# Patient Record
Sex: Male | Born: 1935 | Race: White | Hispanic: No | Marital: Married | Smoking: Former smoker
Health system: Southern US, Community
[De-identification: ages and names within clinical notes are randomized; demographics above are authoritative.]

## PROBLEM LIST (undated history)

## (undated) DIAGNOSIS — G709 Myoneural disorder, unspecified: Secondary | ICD-10-CM

## (undated) DIAGNOSIS — I10 Essential (primary) hypertension: Secondary | ICD-10-CM

## (undated) DIAGNOSIS — Z8719 Personal history of other diseases of the digestive system: Secondary | ICD-10-CM

## (undated) DIAGNOSIS — E119 Type 2 diabetes mellitus without complications: Secondary | ICD-10-CM

## (undated) DIAGNOSIS — J449 Chronic obstructive pulmonary disease, unspecified: Secondary | ICD-10-CM

## (undated) DIAGNOSIS — K219 Gastro-esophageal reflux disease without esophagitis: Secondary | ICD-10-CM

## (undated) DIAGNOSIS — H33011 Retinal detachment with single break, right eye: Secondary | ICD-10-CM

## (undated) HISTORY — PX: HEMORROIDECTOMY: SUR656

## (undated) HISTORY — PX: JOINT REPLACEMENT: SHX530

## (undated) HISTORY — PX: POPLITEAL VENOUS ANEURYSM REPAIR: SHX2244

---

## 2000-11-28 ENCOUNTER — Inpatient Hospital Stay (HOSPITAL_COMMUNITY): Admission: EM | Admit: 2000-11-28 | Discharge: 2000-12-04 | Payer: Self-pay | Admitting: Emergency Medicine

## 2000-11-28 ENCOUNTER — Encounter: Payer: Self-pay | Admitting: Emergency Medicine

## 2001-09-30 ENCOUNTER — Encounter: Payer: Self-pay | Admitting: Emergency Medicine

## 2001-09-30 ENCOUNTER — Emergency Department (HOSPITAL_COMMUNITY): Admission: EM | Admit: 2001-09-30 | Discharge: 2001-09-30 | Payer: Self-pay | Admitting: Emergency Medicine

## 2002-09-15 ENCOUNTER — Encounter: Payer: Self-pay | Admitting: Orthopaedic Surgery

## 2002-09-15 ENCOUNTER — Inpatient Hospital Stay (HOSPITAL_COMMUNITY): Admission: RE | Admit: 2002-09-15 | Discharge: 2002-09-19 | Payer: Self-pay | Admitting: Orthopaedic Surgery

## 2002-11-09 ENCOUNTER — Ambulatory Visit (HOSPITAL_COMMUNITY): Admission: RE | Admit: 2002-11-09 | Discharge: 2002-11-10 | Payer: Self-pay | Admitting: Ophthalmology

## 2002-11-12 ENCOUNTER — Ambulatory Visit (HOSPITAL_COMMUNITY): Admission: AD | Admit: 2002-11-12 | Discharge: 2002-11-13 | Payer: Self-pay | Admitting: Ophthalmology

## 2004-12-02 ENCOUNTER — Ambulatory Visit: Payer: Self-pay | Admitting: Gastroenterology

## 2004-12-17 ENCOUNTER — Encounter (INDEPENDENT_AMBULATORY_CARE_PROVIDER_SITE_OTHER): Payer: Self-pay | Admitting: *Deleted

## 2004-12-17 ENCOUNTER — Ambulatory Visit: Payer: Self-pay | Admitting: Gastroenterology

## 2005-10-27 ENCOUNTER — Ambulatory Visit (HOSPITAL_COMMUNITY): Admission: RE | Admit: 2005-10-27 | Discharge: 2005-10-27 | Payer: Self-pay | Admitting: Ophthalmology

## 2006-07-02 ENCOUNTER — Ambulatory Visit (HOSPITAL_COMMUNITY): Admission: RE | Admit: 2006-07-02 | Discharge: 2006-07-02 | Payer: Self-pay | Admitting: Orthopaedic Surgery

## 2006-08-20 ENCOUNTER — Inpatient Hospital Stay (HOSPITAL_COMMUNITY): Admission: RE | Admit: 2006-08-20 | Discharge: 2006-08-23 | Payer: Self-pay | Admitting: Orthopaedic Surgery

## 2008-12-29 ENCOUNTER — Encounter (INDEPENDENT_AMBULATORY_CARE_PROVIDER_SITE_OTHER): Payer: Self-pay | Admitting: *Deleted

## 2009-01-04 ENCOUNTER — Encounter: Payer: Self-pay | Admitting: Internal Medicine

## 2010-04-20 ENCOUNTER — Emergency Department (HOSPITAL_COMMUNITY): Admission: EM | Admit: 2010-04-20 | Discharge: 2010-04-20 | Payer: Self-pay | Admitting: Emergency Medicine

## 2010-08-25 ENCOUNTER — Encounter: Payer: Self-pay | Admitting: Orthopaedic Surgery

## 2010-10-17 LAB — POCT CARDIAC MARKERS
CKMB, poc: 15.4 ng/mL (ref 1.0–8.0)
Myoglobin, poc: 344 ng/mL (ref 12–200)
Myoglobin, poc: 430 ng/mL (ref 12–200)
Troponin i, poc: <0.05 ng/mL (ref 0.00–0.09)
Troponin i, poc: <0.05 ng/mL (ref 0.00–0.09)

## 2010-10-17 LAB — URINALYSIS, ROUTINE W REFLEX MICROSCOPIC
Glucose, UA: NEGATIVE mg/dL
Nitrite: NEGATIVE
Urobilinogen, UA: 0.2 mg/dL (ref 0.0–1.0)
pH: 7.5 (ref 5.0–8.0)

## 2010-10-17 LAB — CBC
Hemoglobin: 11.8 g/dL — ABNORMAL LOW (ref 13.0–17.0)
Platelets: 175 10*3/uL (ref 150–400)
RBC: 4 MIL/uL — ABNORMAL LOW (ref 4.22–5.81)
WBC: 7.9 10*3/uL (ref 4.0–10.5)

## 2010-10-17 LAB — DIFFERENTIAL
Eosinophils Relative: 2 % (ref 0–5)
Monocytes Absolute: 0.5 10*3/uL (ref 0.1–1.0)

## 2010-10-17 LAB — CK TOTAL AND CKMB (NOT AT ARMC)
Relative Index: 2.7 — ABNORMAL HIGH (ref 0.0–2.5)
Total CK: 956 U/L — ABNORMAL HIGH (ref 7–232)

## 2010-10-17 LAB — GLUCOSE, CAPILLARY: Glucose-Capillary: 164 mg/dL — ABNORMAL HIGH (ref 70–99)

## 2010-12-20 NOTE — Op Note (Signed)
[name], [name]                          ACCOUNT NO.:  [id number]   MEDICAL RECORD NO.:  [id number]                   PATIENT TYPE:  OIB   LOCATION:  5707                                 FACILITY:  [facility]   PHYSICIAN:  Guadelupe Sabin, M.D.             DATE OF BIRTH:  [birth year]   DATE OF PROCEDURE:  11/09/2002  DATE OF DISCHARGE:  11/10/2002                                 OPERATIVE REPORT   PREOPERATIVE DIAGNOSIS:  Rhegmatogenous retinal detachment, right eye.   POSTOPERATIVE DIAGNOSIS:  Rhegmatogenous retinal detachment, right eye.   NAME OF OPERATION:  Scleral buckling procedure, right eye, using solid  silicone implants, #s 279 and 240.  Cryo application, diathermy application,  and drainage of subretinal fluid.   SURGEON:  Stormy Fabian, M.D.   ASSISTANT:  Nurse.   ANESTHESIA:  General.   OPHTHALMOSCOPY:  As previously described.   OPERATIVE PROCEDURE:  After the patient was prepped and draped, lid traction  sutures were placed in the right upper and lower lids.  A speculum was then  inserted.  A peritomy was performed adjacent to the limbus 360 degrees.  The  subconjunctival tissue was cleaned and felt to be in satisfactory condition  for the proposed lamellar scleral dissection.  As the retinal detachment was  over and in the area of the lateral rectus muscle, this muscle was detached  from its anatomic insertion and a 4-0 chromic catgut suture placed in its  tendon for later replacement.  This provided adequate exposure of the  retinal detachment area.  Localization was then carried out with the retinal  cryoprobe and the tear itself treated.  It was then elected to perform  lamellar scleral dissection from the 11-11:30 to 8 o'clock position, the bed  measuring initially 7 mm but later extended to 9 mm in width to accommodate  a #279 solid silicone implant.  Light diathermy applications were applied to  the inner scleral lamella.  The patient was given  Diamox intravenously to  lower the intraocular pressure.  A total of three 4-0 green Mersilene  sutures were used to close the scleral flaps with 3-0 plain catgut  reinforcements over a trimmed #279 solid silicone implant.  A #240 solid  silicone encircling band was placed about the globe, tied with two sutures  at the 4 o'clock position.  Anchoring sutures were later placed at the 2 and  4 o'clock positions to hold the encircling band in place.  The patient had  been given Diamox 500 mg intravenously to lower the intraocular pressure.  The scleral flaps were gradually pulled up but the intraocular pressure was  elevated. It was therefore elected to perform a paracentesis using a Wheeler  knife and a limbal incision through the cornea to allow the aqueous to exit  and the intraocular pressure to be lowered.  This was performed on 2-3  occasions.  The retinal artery  perfusion was continually monitored  throughout the procedure.  The scleral flaps were closed but there still  appeared to be slight elevation of the retina and posterior fluid.  It was  therefore elected to do a transscleral drainage of the subretinal fluid even  though it was of a limited amount.  An incision was made through the scleral  lamella, the choroid treated with light diathermy and then perforated with  the pin electrode.  A small amount of subretinal fluid drained, further  lowering the intraocular pressure.  There was no retinal or choroidal  hemorrhage seen or bloody drainage from the drain site.  The scleral flaps  were pulled up creating a better scleral buckling defect and there appeared  to be only a minimal amount of subretinal fluid remaining.  The tear itself  appeared to be flat on the implant surface although elevated over it.  It  was therefore elected to close, feeling that further manipulation might  cause further deterioration in the retinal tear status.  The tension of the  encircling band and the  scleral flaps were secured permanently.  Tenon's  capsule was pulled forward in the four quadrants and tied as a separate  layer.  The conjunctiva was then pulled forward and closed with a running 6-  0 chromic catgut suture.  Depot Garamycin and dexamethasone were injected in  the subtenon space inferiorly.  A light patch and protective shield were  applied to the operated right eye along with Maxitrol and Atropine ointment.  Duration of procedure 1-1/2 hours.  The patient tolerated the procedure well  in general, left the operating room for the recovery room in good condition.                                               Guadelupe Sabin, M.D.    HNJ/MEDQ  D:  11/11/2002  T:  11/12/2002  Job:  [id number]

## 2010-12-20 NOTE — H&P (Signed)
[name], [name]                          ACCOUNT NO.:  [id number]   MEDICAL RECORD NO.:  [id number]                   PATIENT TYPE:  OIB   LOCATION:  5707                                 FACILITY:  [facility]   PHYSICIAN:  Guadelupe Sabin, M.D.             DATE OF BIRTH:  1932/05/12   DATE OF ADMISSION:  11/09/2002  DATE OF DISCHARGE:  11/10/2002                                HISTORY & PHYSICAL   This was an urgent outpatient admission of this 77 year-old white male  admitted with a rhegmatogenous retinal detachment of the right eye.   HISTORY OF PRESENT ILLNESS:  This patient on 11/05/2002 noted a sudden onset  of decreased vision in his right eye associated with a large floater and  lightening flashes.  The patient was seen by Dr. Elise Benne, his regular  ophthalmologist, and the diagnosis of a retinal detachment made.  The  patient was referred to my office for further evaluation and surgical care.   PAST MEDICAL HISTORY:  The patient is under the care of Dr. Cristy Friedlander.  Dr.  Cristy Friedlander notes that the patient has non-insulin dependent diabetes mellitus,  well-controlled on Actos and Glucotrol XL.  The patient also takes Advair  inhaler and Singular for asthma.   ALLERGIES:  Patient has asthma but no definite allergies to drugs.   REVIEW OF SYSTEMS:  No cardiorespiratory complaints.  The patient states  that he is stable at the present time with only occasional tightness of  his asthma.   PHYSICAL EXAMINATION:  VITAL SIGNS:  As recorded on admission, blood  pressure 159/79, respirations 16, heart rate 87, temperature 99.1.  GENERAL APPEARANCE:  The patient is a well-developed, well-nourished white  male in no acute distress.  HEENT:  Eyes:  Visual acuity without correction 20/100 right eye,  20/80  left eye, with correction 20/40 right eye, 20/30 left eye.  Applanation  tonometry 13 mm each eye.  External ocular and slit-lamp examination:  The  eyes are wet and clear with a  clear cornea, deep and clear anterior chamber  and clear lens.  Detailed fundus examination:  Right eye reveals a temporal  retinal detachment with an area of lattice retinal degeneration and an  associated horseshoe retinal tear along its border.  The retinal detachment  does not extend into the macular area.  Left eye normal.  CHEST:  Lungs clear to percussion and auscultation.  Rare rhonchi heard.  No  rales.  ABDOMEN:  Negative.  EXTREMITIES:  Negative.   ADMISSION DIAGNOSIS:  Rhegmatogenous retinal detachment, right eye, single  defect    SURGICAL PLAN:  Scleral buckling procedure, right eye, with possible  vitrectomy.  Patient and his family have been given oral discussion and  printed information concerning the procedure and its possible complications.  They have signed an Informed Consent and arrangements made for his  outpatient admission at this time.  Guadelupe Sabin, M.D.    HNJ/MEDQ  D:  11/11/2002  T:  11/12/2002  Job:  [id number]   cc:   Eda Paschal  [address]  [place]  [place] [address]  Fax: [phone]   Vincenza Hews, M.D.  [address]  [place]  [place] [address]  Fax: [phone]

## 2010-12-20 NOTE — Op Note (Signed)
NAMEJERAD, DUNLAP NO.:  [id number]   MEDICAL RECORD NO.:  [id number]          PATIENT TYPE:  INP   LOCATION:  2550                         FACILITY:  [facility]   PHYSICIAN:  Claude Manges. Whitfield, M.D.DATE OF BIRTH:  1932/04/30   DATE OF PROCEDURE:  08/20/2006  DATE OF DISCHARGE:                               OPERATIVE REPORT   PREOPERATIVE DIAGNOSIS:  Failed bipolar arthroplasty, right hip.   POSTOPERATIVE DIAGNOSIS:  Failed bipolar arthroplasty, right hip.   PROCEDURE:  Revision arthroplasty, right hip.   SURGEON:  Claude Manges. Cleophas Dunker, M.D.   ASSISTANT:  Thereasa Distance A. Chaney Malling, M.D. and Arnoldo Morale, P.A.-C.   ANESTHESIA:  General.   COMPLICATIONS:  None.   COMPONENTS:  Deep profile acetabular cuff, 60 mm diameter, with Gription  coating; a Pinnacle Marathon acetabular liner, +4 with a 10-degree  posterior lip and a 36-mm outer diameter femoral head and a +5 neck  length.   PROCEDURE:  With the patient comfortable on the operating table and  under general orotracheal anesthesia, nursing staff inserted a Foley  catheter.  The patient was then placed in the lateral decubitus position  with the right side up.  The patient was secured to the operating table  with the Innomed hip system.   The right hip was then prepped with Betadine scrub and then DuraPrep  from iliac crest to about the midcalf.  Sterile draping was performed.   The previous Southern incision centered over the greater trochanter was  utilized and via sharp dissection carried down to subcutaneous tissue.  Small bleeders were Bovie coagulated.  Self-retaining retractors were  inserted.  The iliotibial band was identified and incised along the  length of the skin incision.  The retractors were placed more deeply.  With the hip internally rotated, the scarred in short external rotators  were then incised.  The capsule was identified and incised.  There was a  moderate amount of slightly  orange-tinged joint fluid.  This was sent  for stat gram stain revealing very few monocytes and no organisms.  We  did send for final aerobic and anaerobic culture.  The capsule was then  opened deeply, and additional synovial fluid was removed.  There was  abundant synovial tissue within the joint that was debrided with a  rongeur and the Bovie.  It was able to be dislocated posteriorly, and by  using a bone tamp, I was able to remove the 28-mm head with the  polyethylene liner and the metallic outer ball in one piece.  The joint  was further explored, and a radical synovectomy was performed.  I was  able to release the capsule enough that I could place the [facility] taper  portion of the femoral component anteriorly, and I also placed a suture  over the capsule posteriorly so I could retract it out of the field and  protect the sciatic nerve.  There was a thickened synovial liner within  the acetabulum.  This was removed so at this point we could easily  visualize the entire acetabulum.  There was evidence of some early  protrusio, but the bone was nice and thick, and there were no areas that  were devoid of bone for 360 degrees.  Reaming was performed sequentially  from 45 mm outer diameter to a 59 mm outer diameter.  We had nice  bleeding bone.  There were several areas were there had been cyst  formation.  These were filled with bone graft, both from the patient's  reaming as well as from the IC graft chamber.  We at that point trialed  several size acetabula.  We felt that the 58 would not completely seat,  had very nice rim fit, so we elected to use a 60-mm outer diameter.  Even with the 60- mm revision thickened acetabular cup, there was still  room for bone graft.  Accordingly, the remainder of the bone graft was  then placed into the center of the acetabulum and then ground with a  reverse reamer.  The final revision metallic acetabulum was then  impacted.  We had very, very nice  purchase, and it was perfectly stable.  We felt that we had seated it as far as the bone graft.  We trialed the  polyethylene +4 liner and then applied the 36-mm outer diameter hip  ball.  We trialed several neck lengths and felt that the +5 was the most  stable.  The patient was approximately a half an inch short  preoperatively, and we had reestablished leg length equality using the  +5 neck.   The trial components were then removed.  The joint was then copiously  irrigated with saline solution.  The final Marathon polyethylene liner  was then impacted.  We cleaned the Morse taper neck, made sure that  there was no loose material or bone graft loose within the acetabulum.  We then applied the 36-mm outer diameter hip ball with a +5 neck length.  The entire construct was then reduced, and through a full range of  motion we had perfect stability.  The wound was again irrigated with  saline solution.  I closed the capsule anatomically with a #1 Ethibond.  The short external rotators were closed with a similar material.  I used  a #1 Vicryl to close the iliotibial band and a 0 and 2-0 Vicryl for the  subcutaneous tissue.  The skin was closed with skin clips.  A sterile  bulky dressing was applied.   The patient was then awoken, placed supine on the operating stretcher.  Leg lengths appeared to be symmetrical, and he was moving his toes in  dorsiflexion and plantarflexion.  A knee immobilizer was applied.  The  patient returned to the postanesthesia recovery room in satisfactory  condition.      Claude Manges. Cleophas Dunker, M.D.  Electronically Signed     PWW/MEDQ  D:  08/20/2006  T:  08/20/2006  Job:  [id number]

## 2010-12-20 NOTE — H&P (Signed)
   [name], [name]                          ACCOUNT NO.:  [id number]   MEDICAL RECORD NO.:  [id number]                   PATIENT TYPE:  OIB   LOCATION:  5712                                 FACILITY:  [facility]   PHYSICIAN:  [name], M.D.             DATE OF BIRTH:  Aug 04, 1932   DATE OF ADMISSION:  11/12/2002  DATE OF DISCHARGE:  11/13/2002                                HISTORY & PHYSICAL   REASON FOR ADMISSION:  This was a planned urgent outpatient readmission of  this 77 year old white male admitted with a persistent rhegmatogenous  retinal detachment of the right eye.   HISTORY OF PRESENT ILLNESS:  This patient was found to have a rhegmatogenous  retinal detachment of the right eye.  He was previously admitted on 11/09/2002  for a scleral buckling procedure of the right eye performed under general  anesthesia.  At surgery, there was some residual subretinal fluid which was  felt would absorbe spontaneously.  The patient was discharged from the  hospital on 11/10/2002 and placed at bed rest with bathroom privileges at  home.  He returned to the office, and it was apparent that the retina was  not settling on the previously placed implant and that further surgery would  be indicated.  The patient, therefore, is admitted at this time for a  posterior vitrectomy with vitreous air exchange and additional laser  photocoagulation.  (See previous admission History and Physical, Operative  Note, and Discharge Summary.)   HOSPITAL COURSE:  The patient was evaluated preoperatively and felt to be in  satisfactory condition for the proposed surgery.  His blood sugar was 127 on  the morning of admission, and the patient was Jeff.p.o. in anticipation of the  surgery.  The patient, therefore, was taken into the operating room where a  posterior vitrectomy was performed with vitreous Perfluoron and Perfluoron  air exchange and indirect laser photocoagulation.                        [name], M.D.    HNJ/MEDQ  D:  11/13/2002  T:  11/13/2002  Job:  [id number]

## 2010-12-20 NOTE — H&P (Signed)
[name], [name]                          ACCOUNT NO.:  [id number]   MEDICAL RECORD NO.:  [id number]                   PATIENT TYPE:  INP   LOCATION:  NA                                   FACILITY:   PHYSICIAN:  Claude Manges. Cleophas Dunker, M.D.            DATE OF BIRTH:  [birth year]   DATE OF ADMISSION:  09/15/2002  DATE OF DISCHARGE:                                HISTORY & PHYSICAL   CHIEF COMPLAINT:  Progressively worsening left hip pain for the last two  years.   HISTORY OF PRESENT ILLNESS:  This 77 year old white male patient presented  to Dr. Claude Manges. Whitfield with a history of a right hip replacement by Dr.  Cleophas Dunker in 1980 and then a subsequent revision of the right hip  replacement by Dr. Lenard Galloway. Mortenson in 1997.  He has had continued  complaints of pain in the right hip since that time but was doing well with  the left hip until about two years ago.  At that time, he had the gradual  onset of progressively worsening left hip pain.  He has had no known injury  or prior surgery to his left hip.   At this point, the pain is described as an intermittent aching sensation  which seems to be located over the trochanteric buttock and groin region  without any additional radiation.  The pain seems to be worse if he sits on  his wallet or after he sits for prolonged periods of time and then move to a  standing position then he has pain until he is able to walk for a while.  He  also has pain when he plays golf or if he walks for a prolonged period of  time.  Nothing really alleviates the pain.  He is able to sleep on that left  side and the pain is does not keep him up at night.  He complains of  occasional left leg numbness when he sits for prolonged periods of time and  then when he stands up that numbness goes away.  He currently only takes  Tylenol P.M. to help him sleep at night and no other medications for pain.  He does not walk with any assistive devices.   ALLERGIES:  1. He had a STEROID PREPARATION while he was in the hospital in April of     2002 and he had problems with his TONGUE and THROAT SWELLING.  2. MORPHINE causes SEVERE NAUSEA.   CURRENT MEDICATIONS:  1. Advair Diskus 500/50 mg one puff inhaled twice daily.  2. Singulair 10 mg one tablet p.o. daily.  3. Glucotrol XL 10 mg one tablet p.o. daily.  4. Albuterol nebulizer treatment inhaled about four times a day as needed.   PAST MEDICAL HISTORY:  He has had borderline diabetes for the last ten  years.  He had a hiatal hernia treated in 1972 with surgery.  No  problem  since that time.  He was diagnosed with asthma in 1964 and his last severe  attack was in April of 2002.  He has never had to be intubated for his  asthma.  His medical doctor is Dr. Yehuda Budd in [place] and his  pulmonologist is Dr. Lonzo Cloud. Nadel with [facility].   He denies any history of hypertension, thyroid disease, peptic ulcer  disease, heart disease or any other chronic medical condition other than  noted previously.   PAST SURGICAL HISTORY:  1. Tonsillectomy in 1945.  2. Hiatal hernia repair by Dr. Sheryn Bison in 1972.  3. Right total hip arthroplasty by Dr. Norlene Campbell in 1981.  4. Right total hip arthroplasty revision by Dr. Rinaldo Ratel in 1997.  5. Hemorrhoidectomy by Dr. Rolene Course in 1988.   He denies any complications from the abovementioned procedures.   SOCIAL HISTORY:  He has a twenty pack year of cigarette smoking which he  quit in the 1970's.  He drinks just an occasional glass of wine.  He does  not use any drugs.  He is married and has three children.  His wife's name  is Garret Teale.  He and his wife live in a three-story house with eight steps  into the main entrance.  His bedroom is on the third floor and his family's  business office is in the basement of the house.  His medical doctor is Dr.  Yehuda Budd in [facility] at [phone] and Dr. Alroy Dust at  [facility].   FAMILY HISTORY:  His mother is alive at age 41 with osteoarthritis.  His father passed away at age 79 with asthma, pneumonia and a  stroke.  He has one brother age 79 and one sister age 102.  The sister has asthma.  Both brother and sister are alive.  He has three  sons age 23, 56 and 24 and they are all healthy.   REVIEW OF SYSTEMS:  He does have frequent sinus congestion which he treats  with Sinutab.  He occasionally has a headache with that.  In December of  2003, he had a severe cold that lasted for several weeks but that has not  resolved.  About two weeks ago and then the day before yesterday, he did  have some problems with nausea first thing in the morning and vomiting some  green bile fluid, that may be due to nerves.  He has had no problems since  then.  He does have a history of some problems with his right shoulder in  the past but that is fine at this time.  He does wear glasses.  He has a  living will and his power of attorney is his wife, Adorian Gwynne.  All other  systems are negative and noncontributory.   PHYSICAL EXAMINATION:  GENERAL:  Well developed, well nourished thin white  male in no acute distress.  Height 5'7, weight 170 pounds, body mass index  is 26.  VITAL SIGNS:  Temperature 97.4 degrees Fahrenheit, pulse 72, respirations 16  and blood pressure 146/78.  HEENT:  Normocephalic-atraumatic without frontal or maxillary sinus  tenderness to palpation.  Conjunctivae are pink.  Sclerae anicteric.  PERRLA.  EOM's intact.  No visual or external ear deformities.  Hearing  grossly intact.  Tympanic membranes are pearly gray bilaterally with good  light reflex.  Nose and nasal septum midline.  Nasal mucosa pink and moist  without exudates or polyps noted.  Buccal  mucosa pink and moist.  Good  dentition.  Pharynx without erythema or exudate.  Tongue and uvula midline. Tongue without fasciculations and  uvula rises equally with phonation.  NECK:  No visible masses and no lesions noted.  Trachea midline.  No  palpable lymphadenopathy nor thyromegaly.  Carotid pulses are +2 bilaterally  without bruits.  Full range of motion and nontender to palpation along the  cervical spine.  CARDIOVASCULAR:  Heart rate and rhythm regular.  S1 and S2 present without  rubs, clicks or murmurs noted.  RESPIRATORY:  Respirations are even and unlabored.  Breath sounds clear to  auscultation bilaterally without rales or wheezes noted.  ABDOMEN:  Rounded abdominal contour.  Bowel sounds present times four  quadrants.  Soft and nontender to palpation without hepatosplenomegaly nor  costovertebral angle tenderness.  Femoral pulses are +2 bilaterally.  Nontender to palpation along the entire length of the vertebral column.  BREAST/GENITOURINARY/RECTAL:  These examinations are deferred at this time.  MUSCULOSKELETAL:  No obvious deformities bilateral upper extremities with  full range of motion of these extremities without pain.  Radial pulses are  +2 bilaterally.  He has full range of motion of his knees, ankles and toes  bilaterally.  Dorsalis pedis and posterior tibial pulses are +2.  He does  have +1 pitting edema of bilateral lower extremities but negative Homan sign  and no calf pain with palpation bilaterally.  Full range of motion of both  knees from 0 to about 120 degrees with no palpation along the joint line.  Right hip has full extension and flexion to 90 degrees.  He does have good  internal and external rotation of the right hip with minimal pain.  No pain  with palpation over the right groin or greater trochanter.  No pain with  palpation of the left groin or greater trochanter.  He has full extension  and flexion to about 85 degrees but starts to externally rotate when we get  up to the extremes in flexion.  He has absolutely no internal or external  rotation of that left hip and any attempt of that  motion does cause pain.  When he stands, his right hip may be a little bit shorter than the left  possibly an eighth of an inch.  NEUROLOGIC:  Alert and oriented x3.  Cranial nerves II through XII are  grossly intact.  Strength 5 out of 5 bilateral upper and lower extremities.  Rapid alternating movements intact.  Deep tendon reflexes 2+ bilateral upper  and lower extremities.  Sensation intact to light touch.   RADIOLOGIC FINDINGS:  X-rays taken of his left hip in January of 2004 show  considerable avascular necrosis of the left femoral head with collapse of  the head.   IMPRESSION:  1. Avascular necrosis, left hip.  2. History of right total hip replacement with subsequent revision.  3. Type-2 diabetes mellitus.  4. Asthma.  5. History of hiatal hernia treated with surgery.   PLAN:  Mr. Einstein will be admitted to [facility] on September 15, 2002 where he will undergo a left total hip arthroplasty by Dr. Claude Manges. Whitfield.  He will undergo all the routine preoperative laboratory tests  and studies prior to this procedure.  If we has any medical issues while he  is hospitalized, we will consult Dr. Kriste Basque.     Legrand Pitts Duffy, P.A.  Claude Manges. Cleophas Dunker, M.D.    KED/MEDQ  D:  09/07/2002  T:  09/07/2002  Job:  [id number]

## 2010-12-20 NOTE — Op Note (Signed)
NAMEMIHAILO, SAGE                          ACCOUNT NO.:  [id number]   MEDICAL RECORD NO.:  [id number]                   PATIENT TYPE:  INP   LOCATION:  2893                                 FACILITY:  [facility]   PHYSICIAN:  Claude Manges. Cleophas Dunker, M.D.            DATE OF BIRTH:  October 18, 1931   DATE OF PROCEDURE:  09/15/2002  DATE OF DISCHARGE:                                 OPERATIVE REPORT   PREOPERATIVE DIAGNOSIS:  Osteoarthritis, left hip.   POSTOPERATIVE DIAGNOSIS:  Osteoarthritis, left hip (possible avascular  necrosis).   PROCEDURE:  Left total hip replacement.   SURGEON:  Claude Manges. Cleophas Dunker, M.D.   ASSISTANTS:  Lenard Galloway. Chaney Malling, M.D., and Legrand Pitts. Duffy, P.A.   ANESTHESIA:  Spinal.   COMPLICATIONS:  None.   COMPONENTS:  DePuy AML 56 mm outer diameter 100 Series with a Marathon poly  10 degree lip, apex hole eliminator, a small-stature 12 mm prodigy hip stem,  32 mm hip ball with a +5 neck length.   DESCRIPTION OF PROCEDURE:  With the patient comfortable on the operating  table and under spinal anesthetic, a Foley catheter was inserted by the  nursing staff.  The patient was then placed in the lateral decubitus  position with the left side up and secured to the operating room table with  the Innomed hip system.   The left hip was then prepped with Betadine scrub and Duraprep from the  iliac crest to well below the knee.  Sterile draping was performed.   A routine southern incision was utilized and via sharp dissection carried  down through the subcutaneous tissue.  Bleeders were Bovie coagulated.  The  iliotibial band was identified, incised along the length of the skin  incision.   The iliotibial band was then incised along the length of the skin incision.  Short external rotators were identified, tendinous structures were tagged  with 0 Tycron suture.  The remainder of the short external rotators were  then incised in their posterior attachment to the greater  trochanter.   The capsule was identified and incised along the femoral neck.  There was a  small amount of purulent joint effusion.  The hip was then dislocated  posteriorly.  There was considerable erosion at the very tip that may be  consistent with avascular necrosis, but there were large areas of absent  articular cartilage.  The AML guide was used to osteotomize the femoral  neck.   Retractors were then inserted.  A starter hole was then made through the  greater trochanteric notch.  The canal finder was inserted.  Reaming was  performed to 11.5 mm, at which point there was excellent endosteal purchase.  I inserted the 10.5 and then the 12 mm broaches and then used the calcar  reamer to obtain the appropriate calcar angle.   Retractors were then placed about the acetabulum.  Labrum was excised.  Reaming  was performed to 55 mm to accept a 56 mm prosthesis.  I had  excellent bleeding throughout the acetabular wall.   A 56 mm outer diameter acetabular component was then impacted, 100 Series,  with a single apical hole.  The trial acetabular liner was inserted with a  10 degree posterior lip and then the femoral construct was inserted.  Initially we trialled a +1 hip ball and we had excellent stability except in  flexion and adduction, so then I trialled the Prodigy hip stem, which gave  [place] better stability and still no instability with extension and external  rotation.  We felt that the leg lengths were exactly the same as they were  preoperative, i.e., about an eighth of an inch longer on the left side.   All the trial components were then removed.  The joint was copiously  irrigated with saline-antibiotic solution.  The apex hole eliminator was  inserted, followed by the final Marathon polyethylene liner with the 10  degree posterior lip.  The 12.0 small-stature IMMA Prodigy hip stem was then  inserted.  We trialled the +5 neck length, felt this was still perfectly  stable and  not tight in extension, and then inserted the final 32 diameter  +5 neck length hip ball.  Easily reduced.  The hip was placed thought a full  range of motion without instability.   The wound was then irrigated with saline-antibiotic solution.  The capsule  was closed anatomically with 0 Ethibond.  Short external rotators were  closed with similar material.  The iliotibial band was closed with a running  0 Vicryl, the subcu closed in two layers with 0 and 2-0 Vicryl, skin closed  with skin clips.  A sterile bulky dressing was applied.  The patient was  then placed in the supine position and returned to the postanesthesia  recovery room in satisfactory condition.                                               Claude Manges. Cleophas Dunker, M.D.    PWW/MEDQ  D:  09/15/2002  T:  09/15/2002  Job:  [id number]

## 2010-12-20 NOTE — Op Note (Signed)
[name], [name]                          ACCOUNT NO.:  [id number]   MEDICAL RECORD NO.:  [id number]                   PATIENT TYPE:  OIB   LOCATION:  5712                                 FACILITY:  [facility]   PHYSICIAN:  Guadelupe Sabin, M.D.             DATE OF BIRTH:  [birth year]   DATE OF PROCEDURE:  11/13/2002  DATE OF DISCHARGE:  11/13/2002                                 OPERATIVE REPORT   PREOPERATIVE DIAGNOSIS:  Persistent retinal detachment, right eye.   POSTOPERATIVE DIAGNOSIS:  Persistent retinal detachment, right eye.   OPERATION:  Posterior vitrectomy through pars plana using vitreous infusion  suction cutter, a vitreous Perfluoron -- Perfluoron air exchange, indirect  laser photocoagulation.   SURGEON:  Guadelupe Sabin, M.D.   ASSISTANT:  Nurse.   ANESTHESIA:  General.   OPHTHALMOSCOPY:  As previously described.   DESCRIPTION OF PROCEDURE:  After the patient was prepped and draped, the  line of the previous peritomy 360 degrees was reopened, and the catgut  sutures removed.  The conjunctiva was retracted approximately 4 mm.  Three  sclerotomy sites were prepared at the 10, 8, and 2 o'clock positions, 3.5 mm  from the limbus, using the MVR blade.  The 4 mm vitreous infusion terminal  was secured in place at the 8 o'clock position with a preplaced mattress 5-0  Mersilene suture.  The tip could be seen projecting into the vitreous  cavity.  The fiberoptic light pipe was inserted at the 2 o'clock position,  and the hand piece, the vitreous infusion suction cutter at the 10 o'clock  position.  Slow vitreous infusion suction cuttings were begun from an  anterior to posterior direction toward the retina.  The vitreous was  extremely fibrinous with retinal pigment cells and vitreous membranes.  The  vitreous was then cleared back to the retinal surface.  Attention was paid  to the anterior vitreous particularly, which was pulling [name] the retina and  keeping the  tear elevated from the implant surface.  It was then elected to  instill Perfluoron solution which filled the eye up to the level of the  retinal breaks.  The Perfluoron was then exchanged with air infusion.  Indirect ophthalmoscopy was performed intermittently during the procedure,  and after the air exchange, the retina appeared flat [name] the implant surface,  and the subretinal fluid had been removed.  It was the elected to perform  indirect laser photocoagulation.  This was performed with approximately 400  additional burns to the implant surface.  Many of these however did not  cause an adequate burn.  Satisfactory number, however, were placed and felt  to be in good position around the retinal tear.  It was then elected to  close.  The sclerotomy sites were closed with 7-0 Vicryl sutures.  The  conjunctiva was then pulled forward and closed with a running 7-0 Vicryl  suture. Schiotz  tonometry was recorded at approximately 15 to 18 scale units  with a 5.5 g weight indicating a normotensive eye or slightly hypotensive  eye.  Depo-Garamycin and Celestone were injected in the subtenon space  inferiorly.  Maxitrol and  atropine ointment were instilled in the conjunctival cul-de-sac.  A light  patch and protector shield were applied.  Duration of procedure: 1 to 1-1/2  hours.  The patient left the operating room for the recovery room and  subsequently to the 23-hour observation unit.                                                  Guadelupe Sabin, M.D.    HNJ/MEDQ  D:  11/13/2002  T:  11/13/2002  Job:  [id number]   cc:   Vincenza Hews, M.D.  [address], [address]  [place]  [place] [address]  Fax: [phone]

## 2010-12-20 NOTE — H&P (Signed)
[name], [name] NO.:  [id number]   MEDICAL RECORD NO.:  [id number]          PATIENT TYPE:  INP   LOCATION:  NA                           FACILITY:  [facility]   PHYSICIAN:  Claude Manges. [place]field, M.D.DATE OF BIRTH:  1932-02-10   DATE OF ADMISSION:  DATE OF DISCHARGE:                              HISTORY & PHYSICAL   HISTORY OF PRESENT ILLNESS:  [name] is a 77 year old male with a  painful right hip replacement.  The patient has a history of bilateral  hip replacements.  Right hip, the patient initially underwent a Bateman  bipolar arthroplasty in 1980 for a vascular necrosis and then in 1997 he  had a revision of the hip to a total hip replacement by Dr. Chaney Malling  due to failure of the polyethylene component of the [facility] prosthesis.  Patient did well until approximately mid October of 2007 when developed  sensation of something flipping in his hip.  He also had pain in his hip  at that time, which was intermittent.  Pain at that time was moderate in  severity with an aching quality.  At that time he had pain that then  appeared in his sleep.  The pain was worse with walking and better with  rest.  Patient did have any radicular symptoms.  Pain presently in the  right hip only occurs with certain movements.  No waking pain.  He uses  no assistive devices.  CT scan of the right showed the acetabular  component to be in the horizontal to the native acetabular and femoral  component.  Bone scan with a white cell tagging was normal.   ALLERGIES:  1. MORPHINE CAUSES NAUSEA AND VOMITING.  2. CODEINE, PERCOCET, VICODIN, DARVOCET ALL CAUSE ITCHING; HOWEVER      PATIENT BELIEVES HE TOLERATES PERCOCET.   CURRENT MEDICATIONS:  1. Glipizide 10 mg 1 p.o. q.a.m.  2. Metformin 15 mg 1 p.o. q.p.m.  3. Advair 50/250 one q.p.m.  4. Albuterol 0.83 p.r.n.  5. Tylenol PM 2 tabs p.m.  6. B12 1000 mg 1 p.o. q.a.m.  7. Iron 50 mg 1 p.o. q.a.m.  8. Potassium 99 mcg 1 q.a.m.  9. Gluconate 595 mg 1 p.o. q.a.m.  10.Calcium/magnesium/zinc 1000/400/25 mg 1 q. a.m.  11.Tylenol Arthritis as needed.   PAST MEDICAL HISTORY:  1. Bilateral hip replacement.  2. Diabetes mellitus type 2.  3. Hypertension.  4. Anemia.  5. COPD.   PAST SURGICAL HISTORY:  1. Popliteal repair in 1972.  2. Bateman's bipolar hip arthroplasty, Dr. Sherie Don 1980.  3. In 1987, hemorrhoid surgery.  4. In 1997, right hip revision to right total hip.  5. Left total hip by Dr. Cleophas Dunker 2004.   SOCIAL HISTORY:  Patient has a remote history of smoking.  He quit in  1972 after smoking 15 years.  Patient has an occasional alcoholic  beverage.  He lives with his wife in a three-story home.  Patient's  primary care physician is Dr. Barnabas Lister of [place] [place].   FAMILY HISTORY:  Patient's mother alive at age 35, healthy outside  of  arthritis.  Father deceased at age 21 due to complications status post  stroke or seizure, and a history of asthma.  Has 1 living brother age  21, who is reported of being in good health, and a sister age 23 also  reported to be healthy.   REVIEW OF SYSTEMS:  Reveals COPD, hypertension, diabetes mellitus,  recent extraction of tooth left lower jawline.  The patient denies any  fevers, chills, chest pain, shortness of breath, PND, or orthopnea.   PHYSICAL EXAMINATION:  GENERAL:  Patient is a well-developed, well-  nourished male in no acute distress.  VITAL SIGNS:  Temp 98.9, pulse 80, respiratory rate 16, blood pressure  142/70.  HEENT:  Head normocephalic, atraumatic without frontal maxillary sinus  or tenderness to palpation.  Conjunctiva is pink.  Sclera is nonicteric.  PERRLA.  EOM are intact.  No visible external ear deformities noted.  TMs pearly and gray bilaterally.  Nose:  Nasal septum midline.  Nasal  mucosa pink and moist without polyps.  Nasal mucosa is pink and moist.  Patient has good dental repair.  No signs of infection about the gums  or  the area on the lower left jaw where he had a tooth extraction.  CARDIAC:  Regular rate and rhythm.  No murmurs, rubs, or gallops noted.  LUNGS:  Clear to auscultation bilaterally; however, he does have  decreased breath sounds bilateral bases.  No wheezing, rhonchi, or rales  noted.  ABDOMEN:  Soft, nontender.  Bowel sounds x4 quadrants.  No  hepatosplenomegaly noted.  NECK:  The patient has full range of motion of the cervical spine  without pain.  No tenderness with palpation over the cervical column.  Carotids are 2+ bilaterally without bruits.  No lymphadenopathy.  BACK:  No tenderness to palpation over the thoracic lumbar spine.  NEUROLOGIC:  Patient is alert and oriented x3.  Cranial nerves II-XII  are grossly intact.  Lower extremity strength testing reveals 5/5  strength bilaterally.  Deep tendon reflexes are 2+ bilaterally at the  knees and 1+ in the ankles and plantars equal and symmetric.  Patient  has good sensation to light touch in toes throughout.  EXTREMITIES:  Upper extremities:  Equal and symmetric in size and shape.  Has full range of motion in upper extremities without pain.  Lower  extremities:  Right hip 23 degrees internal rotation, external rotation  25 degrees.  Left hip internal rotation is 28 degrees, external rotation  30 degrees.  No pain with range of motion of either hip.  Both his  __________.  Full range of motion of both knees without pain.  Lower  extremities not edematous.  Calves soft and nontender.  Dorsalis pedis  pulses are 2+ bilaterally.   X-RAY:  AP pelvis showed prosthesis to be in excellent position.  No  abnormalities noted.  Femoral component is well-seated in the calcar.  The acetabular appeared to have normal alignment.   CT SCAN:  CT scan of the right hip showed the acetabular component to be  horizontal to the native hip and femoral component.   IMPRESSION:  1. Loose right hip, acetabular component. 2. History of left hip  arthroplasty.  3. Hypertension.  4. Diabetes mellitus type 2.  5. Anemia.  6. Chronic obstructive pulmonary disease.   PLAN:  Patient is to be admitted on August 20, 2006, to [facility] and undergo revision of right total hip per acetabular  loosening.  Prior to surgery,  patient to undergo all preoperative labs  and testing.  Patient did receive preoperative clearance from Dr.  Barnabas Lister, primary care doctor.      Richardean Canal, P.A.      Claude Manges. Cleophas Dunker, M.D.  Electronically Signed    GC/MEDQ  D:  08/11/2006  T:  08/11/2006  Job:  [id number]

## 2010-12-20 NOTE — Discharge Summary (Signed)
[name]  Patient:    [name], [name]                       MRN: [id number] Adm. Date:  [id number] Disc. Date: 12/04/00 Attending:  Carrie [name] CC:         [name]. Jeff Au., M.D., in  [facility]   Discharge Summary  DATE OF BIRTH:  [birth year]  FINAL DIAGNOSES:  1. Admitted with refractory asthmatic bronchitis and underlying chronic     obstructive pulmonary disease with acute exacerbation with multiple     triggers, respiratory insufficiency with PO2 55 on room air at the time of     admission.  2. Diabetes mellitus with good control.  3. Gastroesophageal reflux disease with probable nocturnal asthma     exacerbation; history of Nissen fundoplication in 1972; history of     gastrointestinal bleed in 1980; last endoscopy by [name] in     1990.  4. History of diverticulosis and colon polyps with last colonoscopy in 1998     and follow-up colon due soon.  5. History of elevated LFTs and fatty liver on ultrasound in the past.  6. 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  7. History of tonsillectomy and adenoidectomy as a child.  8. History of hemorrhoid surgery.  9. History of allergy to penicillin and sensitivity to Atrovent.  HISTORY OF PRESENT ILLNESS:  Patient is a 77 year old gentleman regular patient of [name] in [place] and a fellow whom I have seen in the past, last in 1996.  He tells me that he has been using his albuterol inhaler two to three times a day for many years and, about six months ago, was started on Singulair.  He presented with recent exacerbation in his asthma precipitated by upper respiratory tract infection, heavy pollen, and what appeared to be some nocturnal reflux symptoms.  This was unresponsive to increasing his nebulizer treatments, brief course of cortisone, and outpatient Avelox.  He was referred up to the emergency room on November 28, 2000,  admitted by [name] in my absence.  PO2 was noted to be 55 on room air and he was admitted with asthmatic bronchitis, exacerbation of his COPD, and respiratory insufficiency.  PAST MEDICAL HISTORY:  Past history includes diabetes which is controlled with oral agents under the direction of [name].  He has a history of gastroesophageal reflux disease with a previous Nissen fundoplication in 1972 and a gastrointestinal bleed in 1980 for which he was hospitalized here.  His gastroenterologist is [name].  His last endoscopy was 1990.  He has a history of diverticulosis and colon polyps and had a colonoscopy in 1998 with a follow-up due soon.  He has had a history of elevated liver enzymes and fatty infiltration on ultrasound in the past.  He has a history of aseptic necrosis of the right hip with a previous right total hip replacement.  He has had a previous tonsillectomy and adenoidectomy and hemorrhoid surgery.  He is allergic to penicillin and sensitive to Atrovent which causes him to wheeze rather than helping his breathing.  PHYSICAL EXAMINATION:  Physical examination at time of admission revealed a 77 year old gentleman in mild respiratory distress.  Blood pressure was 152/76, pulse 100 and regular, respirations 28 per minute and somewhat shallow, temperature 98.3.  HEENT exam was unremarkable.  Neck exam was supple.  No jugular venous  distention, no carotid bruits, no thyromegaly or lymphadenopathy.  Chest exam revealed diffuse wheezing on inspiration and expiration with a prolonged expiratory phase, respiratory rate around 28, and O2 saturation 88 on room air.  Cardiac exam revealed a regular tachycardia, grade 1/6 systolic ejection murmur at the left sternal border.  No rubs or gallops heard.  The abdomen was soft with minimal epigastric discomfort on palpation.  No evidence of organomegaly or masses.  Extremities showed no cyanosis, clubbing, or edema.  Neurologic  exam was intact without focal abnormalities appreciated.  LABORATORY DATA:  EKG showed normal sinus rhythm and was within normal limits.  Chest x-ray revealed normal heart size and vascularity, some COPD changes with peribronchial thickening, no infiltrates, and no acute abnormalities.  Arterial blood gas showed a pH of 7.38, PCO2 37, PO2 55 on room air in the emergency room.  Hemoglobin 14.4, hematocrit 42.5, white count 10,1000, with 82% segs.  Sodium 143, potassium 3.8, chloride 109, CO2 25, BUN 16, creatinine 1.0, blood sugar 201.  Calcium 9.8, total protein 7.4, albumin 4.2, AST 33, ALT 36, alkaline phosphatase 51, total bilirubin 0.6.  Hemoglobin A1C 6.1. Fasting cholesterol 169, triglyceride 117, HDL 45, LDL 101.  TSH 0.24.  HOSPITAL COURSE: #1 - ASTHMA/COPD:  Patient has a long history of asthma and has chosen to use albuterol inhaler as his main treatment over the years.  He indicates that he would use his nebulizer two or three times a day on average.  About six months ago, Singulair was added to his regimen in an attempt to get him on a controller drug to help his situation, but I suspect he has already had quite a lot of airway remodeling over the years.  This admission was precipitated by a subacute exacerbation caused by upper respiratory tract infection, heavy pollen in the air with underlying allergies, sinus drainage, and some nocturnal exacerbation suggesting reflux disease.  All these factors were addressed during his hospital stay.  He was started on IV Solu-Medrol and given IV Tequin.  He was given hand-held nebulizer treatments initially with albuterol and Atrovent but later switched to Xopenex because of difficulties he felt he had with the Atrovent.  He was placed on mucolytic agents in the form of Humibid LA two tablets p.o. b.i.d. and started on a nasal regimen including Afrin and Nasonex.  Finally, he was started on Protonix and the head of his bed was kept  elevated to address the reflux component.  On this  regimen, he had a very slow response.  He remained wheezy but eventually improved sufficiently to ambulate on the halls without a drop in his O2 saturation.  He felt that this regimen improved his breathing to his baseline level and he was felt to be MHB and ready for discharge on Dec 04, 2000.  We discussed details of his asthma condition and the need for regular medications Please see discharge medications for current list and we hope to wean down to a maintenance level as an outpatient.  #2 - DIABETES MELLITUS:  Patient has a history of diabetes mellitus which had been well controlled on Glucotrol XL and Actos at home.  Hemoglobin A1C was 6.1 here.  Blood sugars were followed carefully throughout his hospital course and he was given sliding scale insulin coverage as needed while he was on the IV Solu-Medrol and IV fluids.  He remained under adequate control.  #3 - GI SYSTEM:  As noted, he has a history of gastroesophageal reflux disease  with previous Nissen fundoplication in 1972.  He also had a history of GI bleed in 1980 and his last endoscopy in 1990.  He is followed regularly by [name].  He has a history of diverticulosis and colon polyps with a colonoscopy in 1998 and a follow-up due soon.  Patient was started on Protonix this hospitalization and head of bed was elevated on blocks.  He is advised to continue these at discharge and will follow up with [name].  I explained the association between nocturnal reflux and nocturnal asthma exacerbations with the patient.  MEDICATIONS AT DISCHARGE:  1. Medrol 16 mg p.o. b.i.d. for five days and one tablet daily until return     office visit.  2. Hand-held nebulizer with albuterol to use q.i.d.  3. Tequin 400 mg p.o. q.d. for five more days until gone.  4. Humibid LA two tablets p.o. b.i.d. with plenty of fluids.  5. Singulair 10 mg p.o. q.d.  6. Uniphyl 600 mg  p.o. q.d.  7. Advair 500/50 one inhalation b.i.d.  8. Nasonex two sprays in each nostril b.i.d.  9. Afrin nasal spray as needed. 10. Protonix 40 mg p.o. q.h.s.  Elevate the head of bed 6 inches on blocks. 11. Glucotrol XL 10 one tablet p.o. q.d. 12. Actos 30 mg p.o. q.d. 13. Cozaar 50 mg p.o. q.d. 14. Tussionex 5 cc p.o. q.12h. p.r.n. cough.  He is instructed on a low-salt, no-sweets diet and advised to rest at home where he is considering getting a better filter system on his home air conditioning unit.  He will call the office for a follow-up appointment in two weeks and follow up with [name] after that.  CONDITION AT DISCHARGE:  Improved. DD:  12/04/00 TD:  12/04/00 Job: [id number]

## 2010-12-20 NOTE — Discharge Summary (Signed)
[name], [name]                          ACCOUNT NO.:  [id number]   MEDICAL RECORD NO.:  [id number]                   PATIENT TYPE:  OIB   LOCATION:  5712                                 FACILITY:  [facility]   PHYSICIAN:  Guadelupe Sabin, M.D.             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  (See detailed admission History and Physical.)   HOSPITAL COURSE:  The patient was evaluated preoperatively and felt to be in  satisfactory condition for the proposed surgery.  He, therefore, was taken  to the operating room where pars plana posterior vitrectomy was performed  with vitreous Perfluoron -- air exchange and indirect laser  photocoagulation.  The patient tolerated the hour to hour and one-half  procedure under general anesthesia well and was taken to the recovery room  and subsequently to the 23-hour observation unit.   The patient was placed on a sliding scale of regular insulin for blood  pressure control of his non-insulin-dependent diabetes mellitus.  The  patient was positioned on his left side or face-down when alert.  The  patient was examined on the evening of surgery and the following morning  with slit lamp examination and indirect ophthalmoscopy.  Applanation  tonometry was recorded at 17 mm on the evening of surgery and 21 on the day  after surgery which was within normal limits.  The patient, therefore, was  examined carefully with indirect ophthalmoscopy.  It was noted that the  vitreous cavity was filled with air and that the retina had been pressed  back in place on the previously placed implant surface.  The tear was  completely closed.   It was felt that the patient had achieved maximal  hospital benefit.  He  could be discharged home to be followed in the office.  The patient was  given a printed list of discharge instructions on the care and use of the  operated eye.   DISCHARGE OCULAR MEDICATIONS:  1. TobraDex ophthalmic solution 1 drop 4 times a day.  2. Cyclomydril ophthalmic solution 1 drop 4 times a day.  3. Maxitrol and atropine ointment at bedtime.   CONDITION ON DISCHARGE:  Improved.    DISCHARGE DIAGNOSIS:  Rhegmatogenous retinal detachment, right eye, single  defect.   SECONDARY DIAGNOSIS:  Non-insulin-dependent diabetes mellitus.                                               Guadelupe Sabin, M.D.    HNJ/MEDQ  D:  11/13/2002  T:  11/13/2002  Job:  [id number]   cc:   Vincenza Hews, M.D.  [address], [address]  [place]  [place] [address]  Fax: [phone]   Dr. [name]

## 2010-12-20 NOTE — Discharge Summary (Signed)
NAMEPALMER, FAHRNER                          ACCOUNT NO.:  [id number]   MEDICAL RECORD NO.:  [id number]                   PATIENT TYPE:  INP   LOCATION:  5040                                 FACILITY:  [facility]   PHYSICIAN:  Claude Manges. Cleophas Dunker, M.D.            DATE OF BIRTH:  1931/09/17   DATE OF ADMISSION:  09/15/2002  DATE OF DISCHARGE:  09/19/2002                                 DISCHARGE SUMMARY   ADMISSION DIAGNOSES:  1. Avascular necrosis, left hip.  2. Type 2 diabetes mellitus.  3. Asthma.  4. History of right total hip arthroplasty with subsequent revision.  5. History of hiatal hernia treated with surgery.   DISCHARGE DIAGNOSES:  1. Avascular necrosis, left hip; status post left total hip arthroplasty.  2. Acute blood loss anemia secondary to surgery.  3. Constipation.  4. Type 2 diabetes mellitus.  5. Asthma.  6. History of right total hip arthroplasty with subsequent revision.  7. History of hiatal hernia treated with surgery.   SURGICAL PROCEDURE:  On September 15, 2002, the patient underwent a left  total hip arthroplasty by Dr. Claude Manges. Whitfield assisted by Dr. Rinaldo Ratel and Arnoldo Morale, P.A.-C.  He had an Articul/eze femoral head 32 mm  +5 on a 12/14 cone with a Prodigy hip stem with Porcoat 12 mm diameter small  stature and then a Pinnacle 100 series acetabular cup size 56 mm.  He had a  Pinnacle acetabular liner, lipped, 56 mm outer diameter, 32 mm inner  diameter and an apex hole eliminator.   COMPLICATIONS:  None.   CONSULTATIONS:  1. Pharmacy consultation for Coumadin therapy September 15, 2002.  2. Physical therapy case management and rehab medicine consultation,     September 16, 2002.  3. Occupational therapy consultation, September 19, 2002.   HISTORY OF PRESENT ILLNESS:  This 77 year old white male patient presented  to Dr. Cleophas Dunker with a history of a right hip replacement in 1980.  He  continued to complain of pain in the right hip but  was doing well with the  left until two years ago.  Since that time, he has had gradual onset of  progressively worsening left hip pain.  It is an intermittent aching  sensation over the trochanteric region, buttock and groin without additional  radiation.  It is worse if he sits on his wallet or sits for a prolonged  period of time and also if he plays golf.  Nothing alleviates the pain.  He  has failed conservative treatment.  Because of that, he is presenting for a  left total hip arthroplasty.   HOSPITAL COURSE:  The patient tolerated his surgical procedure well without  immediate postoperative complications.  He was transferred to 5000.  On  postoperative day #1, he had complaints of pain which were not relieved with  the PCA but the addition of Percocet relieved that.  His hemoglobin was  11.7, hematocrit 33.8.  The leg was neurovascularly intact.  He was started  on PT protocol.   The rest of his postoperative course was basically uneventful.  His wound  was well approximated without drainage.  The leg was neurovascularly intact.  He made good progress with physical therapy and his hemoglobin stabilized.  He did have some mild complaints of constipation which was treated with a  laxative, but he was ready for discharge home on September 19, 2002.   DISCHARGE DIET:  He can resume his regular prehospitalization diet.   DISCHARGE MEDICATIONS:  1. He can resume his prehospitalization medications which included:     A. Advair 500/50 one puff b.i.d.     B. Singulair 10 mg p.o. q.a.m.     C. Glucotrol XL 10 mg p.o. q.a.m.     D. Albuterol nebulizers inhaled p.r.n.  2. Percocet 5/325 mg 1-2 p.o. q.4 h. p.r.n. for pain, #50 with no refill.  3. Coumadin 4 mg p.o. daily unless changed by [facility].   ACTIVITY:  He is to be out of bed, 50% weightbearing or less on the left leg  with use of the walker.  He is arranged for home health, physical therapy,  RN and pharmacy per [facility].   WOUND CARE:  He needs to keep the left hip incision clean and dry and may  shower after no drainage from the wound for two days.  He is to notify Dr.  Cleophas Dunker if temperature greater than or equal to 101.5, chills, pain  unrelieved by pain medications, or foul smelling drainage from the wound.  He is to have his first PT and INR checked Wednesday, February 18.   FOLLOWUP:  He needs followup with Dr. Cleophas Dunker in our office in  approximately ten days and needs to call [phone] for that appointment.   LABORATORY DATA:  X-ray taken of the left hip on February 12, showed  osteoarthritis of the left hip.   On February 13, hemoglobin was 11.7, hematocrit 33.8.  On February 14,  hemoglobin was 11.8, hematocrit 33.7.  On February 15, hemoglobin 12,  hematocrit 34.4.  On February 14, PT was 8.5, INR 1.7. On February 16, PT  19.4, INR 1.8.  On February 10, glucose was 133.  On February 13, it was 147  and on February 14, calcium was 8.3.  On February 10, AST was elevated at  43.  All other laboratory studies were within normal limits.     Legrand Pitts Duffy, P.A.-C                    Claude Manges. Cleophas Dunker, M.D.    KED/MEDQ  D:  10/20/2002  T:  10/21/2002  Job:  [id number]   cc:   Lonzo Cloud. Kriste Basque, M.D. [facility]   [name]. [name], M.D., [facility]

## 2010-12-20 NOTE — Discharge Summary (Signed)
NAMEELVAN, EBRON NO.:  [id number]   MEDICAL RECORD NO.:  [id number]          PATIENT TYPE:  INP   LOCATION:  5009                         FACILITY:  [facility]   PHYSICIAN:  Oris Drone. Petrarca, P.A.-C.DATE OF BIRTH:  1931/12/19   DATE OF ADMISSION:  08/20/2006  DATE OF DISCHARGE:  08/23/2006                               DISCHARGE SUMMARY   DATE OF ADMISSION:  08/20/2006   DATE OF DISCHARGE:  08/23/2006.   ADMISSION DIAGNOSIS:  Failed right total hip arthroplasty.   DISCHARGE DIAGNOSES:  1. Failed right total hip arthroplasty.  2. Type 2 diabetes mellitus.  3. Hypertension.  4. Chronic obstructive pulmonary disease.  5. Anemia.  6. Post hemorrhagic anemia.   PROCEDURE:  Revision right total hip arthroplasty.   HISTORY:  This is a 77 year old white male with a history of bilateral  hip replantation.  Developed right hip pain in mid-October of 2007 with  complaints of hip slipping.  He had pain in the right hip.  It now only  occurs with certain movements.  He has no waking pain and no assistive  devices are used.  His CT scan of the right hip showed the acetabular  component to be horizontal to the native acetabulum and femoral  component.  Bone scan with white cell tagging was normal.  He was  admitted at this time for revision of total joint replacement.   HOSPITAL COURSE:  This is a 77 year old white male admitted on  08/20/2006.  After appropriate laboratory studies were obtained, he was  taken to the operating room where he underwent a revision arthroplasty  of the right hip by Dr. Norlene Campbell, assisted by Dr. Dewaine Conger  and Nathanial Rancher, PA-C.  This was mainly an acetabular revision with a 60-  mm krypton coating with a pinnacle marathon acetabular lining +4 with 10-  degree posterior lip and a 36-mm outer diameter femoral head with a +5  neck length.  He tolerated the procedure well.  He was treated  preoperatively with Ancef, and this was  continued postoperatively, 1  gram intravenously every 8 hours x6 doses.  This was performed because  of the revision nature of the surgery.  He was placed on Heparin 3,000  units subcutaneously every 12 hours until his Coumadin became  therapeutic.  The Dilaudid PCA pump was used in a reduced dose protocol.  Consults with PT, OT, and Care Management were made.  Pressure  weightbearing 50% body weight.  He was allowed out of bed to a chair the  following day.  Percocet was discontinued, and he was placed on Dilaudid  2 mg 1-2 tablets every 4 hours as needed for pain.  He was weaned to  oral pain medicines on August 21, 2006.  His PCA was discontinued on  August 22, 2006, as well as his Foley.  The IV was Hep-locked.  The  remainder of his hospital course was uneventful, and he was discharged  on August 23, 2006, to return back to the office in follow-up.   RADIOGRAPHIC STUDIES:  Studies on August 20, 2006,  revealed a bilateral  total hip prosthesis without inclusion of the inferior portion of the  right femoral component.  No complicating features visualized on this  view.   LABORATORY STUDIES:  Admitted with a hemoglobin of 14.5, hematocrit  42.5%, white count 6,600, platelets 231,000.  Discharge hemoglobin 11.3,  hematocrit 32.0%, white count 11,700, and platelets 207,000.  Preoperative pro-time 14.2, INR 1.1, PTT 32.  Discharge pro-time 19.4,  INR 1.6.  Preoperative sodium 141, potassium 4.7, chloride 102, CO2 of  28, glucose 120, BUN 10, creatinine 0.84, GFR greater than 60, total  protein 6.9, albumin 4.0, AST 45, ALT 40, ALP 48, total bilirubin 0.6.  Discharge sodium 135, potassium 3.8, chloride 103, CO2 of 25, glucose  116, BUN 6, creatinine 0.70, GFR greater than 60.  Hemoglobin A1C was  5.8.  Urinalysis of August 14, 2006, was benign for a voided urine.  Blood type O positive.  Antibody screen negative.  Cultures revealed no  growth times 3 days.  Urine culture showed no  growth.  No anaerobes were  isolated.  An acid-fast smear showed no bacteria.   DISCHARGE INSTRUCTIONS:  No restrictions to diet.  No driving or lifting  for 6 weeks.  Increase his activity slowly.  Use crutches with 50%  weightbearing.  Follow the blue instruction sheet.  Keep the incision  clean and dry and covered daily.  Coumadin 5 mg as instructed by Genevieve Norlander  pharmacist.  Dilaudid 2 mg tablets 1-2 tablets every 4 hours as needed  for pain.  Hold his Benicar.  Genevieve Norlander to check his pro-time on August 24, 2006.  Follow back up with Dr. Cleophas Dunker on September 02, 2006.  Discharged in improved condition.      Oris Drone Petrarca, P.A.-C.     BDP/MEDQ  D:  10/06/2006  T:  10/06/2006  Job:  [id number]

## 2010-12-20 NOTE — Discharge Summary (Signed)
   [name], [name]                          ACCOUNT NO.:  [id number]   MEDICAL RECORD NO.:  [id number]                   PATIENT TYPE:  OIB   LOCATION:  5707                                 FACILITY:  [facility]   PHYSICIAN:  Guadelupe Sabin, M.D.             DATE OF BIRTH:  18-Jan-1932   DATE OF ADMISSION:  11/09/2002  DATE OF DISCHARGE:  11/10/2002                                 DISCHARGE SUMMARY   This was an urgent outpatient admission of this 77 year old white male  admitted with a rhegmatogenous retinal detachment of the right eye.   HOSPITAL COURSE:  The patient was evaluated preoperatively and felt to be in  satisfactory condition for the proposed surgery.  He therefore was taken  into the operating room where a scleral buckling procedure was preformed  under general anesthesia.  Using solid silicone implants with drainage of  subretinal fluid, paracentesis.  The patient tolerated the hour and a half  procedure under general anesthesia well and was taken to the recovery room  and subsequently to the 23-hour observation unit.  The patient was observed  on the evening of surgery and the following morning.  The patient was a non-  insulin dependent diabetic and was followed with the moderately sensitive  regular sliding scale of insulin to control his blood sugars.  On the  morning after surgery it was felt the retina still was elevated and that the  patient might possibly need an intraocular C3F8 injection of gas.  The  patient was kept on his right side for positioning purposes to allowing  settling of the tear.  The patient was then visited again later in the day  where there appeared to be some slight settling of the tear.  It was elected  to discharge the patient to be seen in the office in 24 hours.  The  possibility of further vitrectomy surgery with C3F8 instillation was  explained to the patient.  The patient was therefore discharged home on  TobraDex and Cyclomydril  ophthalmic solutions 1 drop four times a day five  minutes apart, and Maxitrol and Atropine ointment at bedtime.  The patient  was to continue laying on his right side.  Follow up appointment in my  office 24 hours 11/11/2002 at 9:30 AM.   DISCHARGE DIAGNOSIS:  Rhegmatogenous retinal detachment, right eye.   SECONDARY DIAGNOSIS:  Noninsulin dependent diabetes mellitus.                                               Guadelupe Sabin, M.D.    HNJ/MEDQ  D:  11/11/2002  T:  11/12/2002  Job:  [id number]

## 2012-10-25 ENCOUNTER — Other Ambulatory Visit: Payer: Self-pay | Admitting: Orthopedic Surgery

## 2012-10-27 ENCOUNTER — Encounter (HOSPITAL_BASED_OUTPATIENT_CLINIC_OR_DEPARTMENT_OTHER): Payer: Self-pay | Admitting: *Deleted

## 2012-11-02 ENCOUNTER — Encounter (HOSPITAL_BASED_OUTPATIENT_CLINIC_OR_DEPARTMENT_OTHER): Payer: Self-pay | Admitting: Anesthesiology

## 2012-11-02 ENCOUNTER — Ambulatory Visit (HOSPITAL_BASED_OUTPATIENT_CLINIC_OR_DEPARTMENT_OTHER)
Admission: RE | Admit: 2012-11-02 | Discharge: 2012-11-02 | Disposition: A | Discharge status: Home / Self Care | Payer: Medicare Other | Source: Ambulatory Visit | Attending: Orthopedic Surgery | Admitting: Orthopedic Surgery

## 2012-11-02 ENCOUNTER — Ambulatory Visit (HOSPITAL_BASED_OUTPATIENT_CLINIC_OR_DEPARTMENT_OTHER): Payer: Medicare Other | Admitting: Anesthesiology

## 2012-11-02 ENCOUNTER — Encounter (HOSPITAL_BASED_OUTPATIENT_CLINIC_OR_DEPARTMENT_OTHER): Payer: Self-pay | Admitting: Orthopedic Surgery

## 2012-11-02 ENCOUNTER — Encounter (HOSPITAL_BASED_OUTPATIENT_CLINIC_OR_DEPARTMENT_OTHER)
Admission: RE | Disposition: A | Discharge status: Home / Self Care | Payer: Self-pay | Source: Ambulatory Visit | Attending: Orthopedic Surgery

## 2012-11-02 DIAGNOSIS — I10 Essential (primary) hypertension: Secondary | ICD-10-CM | POA: Insufficient documentation

## 2012-11-02 DIAGNOSIS — Z87891 Personal history of nicotine dependence: Secondary | ICD-10-CM | POA: Insufficient documentation

## 2012-11-02 DIAGNOSIS — J449 Chronic obstructive pulmonary disease, unspecified: Secondary | ICD-10-CM | POA: Insufficient documentation

## 2012-11-02 DIAGNOSIS — Z79899 Other long term (current) drug therapy: Secondary | ICD-10-CM | POA: Insufficient documentation

## 2012-11-02 DIAGNOSIS — M6530 Trigger finger, unspecified finger: Secondary | ICD-10-CM | POA: Insufficient documentation

## 2012-11-02 DIAGNOSIS — K219 Gastro-esophageal reflux disease without esophagitis: Secondary | ICD-10-CM | POA: Insufficient documentation

## 2012-11-02 DIAGNOSIS — J4489 Other specified chronic obstructive pulmonary disease: Secondary | ICD-10-CM | POA: Insufficient documentation

## 2012-11-02 DIAGNOSIS — E119 Type 2 diabetes mellitus without complications: Secondary | ICD-10-CM | POA: Insufficient documentation

## 2012-11-02 DIAGNOSIS — M65839 Other synovitis and tenosynovitis, unspecified forearm: Secondary | ICD-10-CM | POA: Insufficient documentation

## 2012-11-02 DIAGNOSIS — G5600 Carpal tunnel syndrome, unspecified upper limb: Secondary | ICD-10-CM | POA: Insufficient documentation

## 2012-11-02 DIAGNOSIS — K449 Diaphragmatic hernia without obstruction or gangrene: Secondary | ICD-10-CM | POA: Insufficient documentation

## 2012-11-02 HISTORY — DX: Gastro-esophageal reflux disease without esophagitis: K21.9

## 2012-11-02 HISTORY — DX: Personal history of other diseases of the digestive system: Z87.19

## 2012-11-02 HISTORY — DX: Type 2 diabetes mellitus without complications: E11.9

## 2012-11-02 HISTORY — PX: CARPAL TUNNEL RELEASE: SHX101

## 2012-11-02 HISTORY — DX: Chronic obstructive pulmonary disease, unspecified: J44.9

## 2012-11-02 HISTORY — DX: Myoneural disorder, unspecified: G70.9

## 2012-11-02 HISTORY — PX: TRIGGER FINGER RELEASE: SHX641

## 2012-11-02 HISTORY — DX: Essential (primary) hypertension: I10

## 2012-11-02 HISTORY — DX: Retinal detachment with single break, right eye: H33.011

## 2012-11-02 LAB — GLUCOSE, CAPILLARY: Glucose-Capillary: 87 mg/dL (ref 70–99)

## 2012-11-02 SURGERY — CARPAL TUNNEL RELEASE
Anesthesia: Monitor Anesthesia Care | Site: Wrist | Laterality: Right

## 2012-11-02 MED ORDER — LIDOCAINE HCL (PF) 0.5 % IJ SOLN
INTRAMUSCULAR | Status: DC | PRN
  Administered 2012-11-02: 50 mL via INTRAVENOUS

## 2012-11-02 MED ORDER — CEFAZOLIN SODIUM-DEXTROSE 2-3 GM-% IV SOLR
2.0000 g | INTRAVENOUS | Status: AC
  Administered 2012-11-02: 2 g via INTRAVENOUS

## 2012-11-02 MED ORDER — CHLORHEXIDINE GLUCONATE 4 % EX LIQD: 60.0000 mL | Freq: Once | CUTANEOUS | Status: DC

## 2012-11-02 MED ORDER — ONDANSETRON HCL 4 MG/2ML IJ SOLN
INTRAMUSCULAR | Status: DC | PRN
  Administered 2012-11-02: 4 mg via INTRAVENOUS

## 2012-11-02 MED ORDER — LACTATED RINGERS IV SOLN
INTRAVENOUS | Status: DC
  Administered 2012-11-02 (×2): via INTRAVENOUS

## 2012-11-02 MED ORDER — CEFAZOLIN SODIUM-DEXTROSE 2-3 GM-% IV SOLR: 2.0000 g | INTRAVENOUS | Status: DC

## 2012-11-02 MED ORDER — MEPERIDINE HCL 25 MG/ML IJ SOLN: 6.2500 mg | INTRAMUSCULAR | Status: DC | PRN

## 2012-11-02 MED ORDER — FENTANYL CITRATE 0.05 MG/ML IJ SOLN
INTRAMUSCULAR | Status: DC | PRN
  Administered 2012-11-02 (×2): 50 ug via INTRAVENOUS

## 2012-11-02 MED ORDER — MIDAZOLAM HCL 2 MG/2ML IJ SOLN: 0.5000 mg | Freq: Once | INTRAMUSCULAR | Status: DC | PRN

## 2012-11-02 MED ORDER — PROPOFOL 10 MG/ML IV BOLUS
INTRAVENOUS | Status: DC | PRN
  Administered 2012-11-02: 20 mg via INTRAVENOUS

## 2012-11-02 MED ORDER — MIDAZOLAM HCL 2 MG/2ML IJ SOLN: 1.0000 mg | INTRAMUSCULAR | Status: DC | PRN

## 2012-11-02 MED ORDER — OXYCODONE HCL 5 MG/5ML PO SOLN
5.0000 mg | Freq: Once | ORAL | Status: DC | PRN
Start: 2012-11-02 — End: 2012-11-02

## 2012-11-02 MED ORDER — PROMETHAZINE HCL 25 MG/ML IJ SOLN: 6.2500 mg | INTRAMUSCULAR | Status: DC | PRN

## 2012-11-02 MED ORDER — FENTANYL CITRATE 0.05 MG/ML IJ SOLN: 25.0000 ug | INTRAMUSCULAR | Status: DC | PRN

## 2012-11-02 MED ORDER — BUPIVACAINE HCL (PF) 0.25 % IJ SOLN
INTRAMUSCULAR | Status: DC | PRN
  Administered 2012-11-02: 10 mL

## 2012-11-02 MED ORDER — MIDAZOLAM HCL 5 MG/5ML IJ SOLN
INTRAMUSCULAR | Status: DC | PRN
  Administered 2012-11-02: 1 mg via INTRAVENOUS

## 2012-11-02 MED ORDER — FENTANYL CITRATE 0.05 MG/ML IJ SOLN: 50.0000 ug | INTRAMUSCULAR | Status: DC | PRN

## 2012-11-02 MED ORDER — OXYCODONE HCL 5 MG PO TABS: 5.0000 mg | ORAL_TABLET | Freq: Once | ORAL | Status: DC | PRN

## 2012-11-02 MED ORDER — PROPOFOL 10 MG/ML IV EMUL
INTRAVENOUS | Status: DC | PRN
  Administered 2012-11-02: 75 ug/kg/min via INTRAVENOUS

## 2012-11-02 SURGICAL SUPPLY — 45 items
BANDAGE COBAN STERILE 2 (GAUZE/BANDAGES/DRESSINGS) ×3 IMPLANT
BANDAGE GAUZE ELAST BULKY 4 IN (GAUZE/BANDAGES/DRESSINGS) ×3 IMPLANT
BLADE SURG 15 STRL LF DISP TIS (BLADE) ×2 IMPLANT
BLADE SURG 15 STRL SS (BLADE) ×2
BNDG COHESIVE 3X5 TAN STRL LF (GAUZE/BANDAGES/DRESSINGS) ×3 IMPLANT
BNDG ESMARK 4X9 LF (GAUZE/BANDAGES/DRESSINGS) IMPLANT
BNDG PLASTER X FAST 3X3 WHT LF (CAST SUPPLIES) IMPLANT
CHLORAPREP W/TINT 26ML (MISCELLANEOUS) ×3 IMPLANT
CLOTH BEACON ORANGE TIMEOUT ST (SAFETY) ×3 IMPLANT
CORDS BIPOLAR (ELECTRODE) ×3 IMPLANT
COVER MAYO STAND STRL (DRAPES) ×3 IMPLANT
COVER TABLE BACK 60X90 (DRAPES) ×3 IMPLANT
CUFF TOURNIQUET SINGLE 18IN (TOURNIQUET CUFF) IMPLANT
DECANTER SPIKE VIAL GLASS SM (MISCELLANEOUS) IMPLANT
DRAPE EXTREMITY T 121X128X90 (DRAPE) ×3 IMPLANT
DRAPE SURG 17X23 STRL (DRAPES) ×3 IMPLANT
DRSG KUZMA FLUFF (GAUZE/BANDAGES/DRESSINGS) ×3 IMPLANT
GAUZE XEROFORM 1X8 LF (GAUZE/BANDAGES/DRESSINGS) ×3 IMPLANT
GLOVE BIO SURGEON STRL SZ 6.5 (GLOVE) ×3 IMPLANT
GLOVE BIO SURGEON STRL SZ7.5 (GLOVE) ×3 IMPLANT
GLOVE BIOGEL PI IND STRL 8 (GLOVE) ×2 IMPLANT
GLOVE BIOGEL PI IND STRL 8.5 (GLOVE) ×2 IMPLANT
GLOVE BIOGEL PI INDICATOR 8 (GLOVE) ×1
GLOVE BIOGEL PI INDICATOR 8.5 (GLOVE) ×1
GLOVE INDICATOR 7.0 STRL GRN (GLOVE) ×3 IMPLANT
GLOVE SURG ORTHO 8.0 STRL STRW (GLOVE) ×3 IMPLANT
GOWN BRE IMP PREV XXLGXLNG (GOWN DISPOSABLE) ×6 IMPLANT
GOWN PREVENTION PLUS XLARGE (GOWN DISPOSABLE) ×3 IMPLANT
NEEDLE 27GAX1X1/2 (NEEDLE) ×3 IMPLANT
NS IRRIG 1000ML POUR BTL (IV SOLUTION) ×3 IMPLANT
PACK BASIN DAY SURGERY FS (CUSTOM PROCEDURE TRAY) ×3 IMPLANT
PAD CAST 3X4 CTTN HI CHSV (CAST SUPPLIES) ×2 IMPLANT
PADDING CAST ABS 4INX4YD NS (CAST SUPPLIES) ×1
PADDING CAST ABS COTTON 4X4 ST (CAST SUPPLIES) ×2 IMPLANT
PADDING CAST COTTON 3X4 STRL (CAST SUPPLIES) ×1
SLEEVE SCD COMPRESS KNEE MED (MISCELLANEOUS) ×3 IMPLANT
SPONGE GAUZE 4X4 12PLY (GAUZE/BANDAGES/DRESSINGS) ×3 IMPLANT
STOCKINETTE 4X48 STRL (DRAPES) ×3 IMPLANT
SUT ETHILON 3 0 PS 1 (SUTURE) ×3 IMPLANT
SUT VICRYL 4-0 PS2 18IN ABS (SUTURE) IMPLANT
SUT VICRYL RAPIDE 4/0 PS 2 (SUTURE) ×6 IMPLANT
SYR BULB 3OZ (MISCELLANEOUS) ×3 IMPLANT
SYR CONTROL 10ML LL (SYRINGE) ×3 IMPLANT
TOWEL OR 17X24 6PK STRL BLUE (TOWEL DISPOSABLE) ×6 IMPLANT
UNDERPAD 30X30 INCONTINENT (UNDERPADS AND DIAPERS) ×3 IMPLANT

## 2012-11-02 NOTE — H&P (Signed)
[name] is a 79 year-old right-hand dominant male who has been seen in the past by Dr. Cleophas Dunker and Dr. Mina Marble for triggering of his right middle finger. He has had two operations on this.  He continues to complain of it catching.  He is complaining, however, of numbness, tingling, pain in his hand and wrist.  This has been going on for the past several years.  He had nerve conductions a year ago by Dr. Johna Roles revealing a peripheral neuropathy with carpal tunnel syndrome bilaterally, motor delays of 6.3 on the left, 8.8 on the right, sensory delay 2.8 on the left, and 2.5 on the right.  He has diminution of his ulnar nerve at the elbow to 41/42.  He is complaining of numbness and tingling of all of his fingers.  He has history of diabetes, no history of thyroid problems, arthritis or gout. He is awakened 4-5 out of 7 nights. He has no history of injury to the hand or neck.  Use of his hands increases this for him. He states that sitting down, putting his arms on arm rest will cause his hands to get numb.     ALLERGIES:   He is allergic to some pain medications, but cannot recall what they are.  MEDICATIONS:    Glipizide, Advair, albuterol, lisinopril, metoprolol, omeprazole, Aleve, aspirin, Tylenol, vitamin D3, B-12, B-6, cranberry, cinnamon, fish oil, potassium, biotin, niacin and gabapentin.  SURGICAL HISTORY:     Hip surgery x 4, hernia repair, hand surgery x 2.  FAMILY MEDICAL HISTORY:    Positive for high blood pressure and arthritis.    SOCIAL HISTORY:     He does not smoke or drink. He is married.    REVIEW OF SYSTEMS:    Positive for glasses, ringing in his ears, high blood pressure, asthma, cough, otherwise negative.  [name] is an 79 y.o. male.   Chief Complaint: CTSrtr STS RMF HPI: see above  Past Medical History  Diagnosis Date  . Diabetes mellitus without complication     NIDDM  . Hypertension   . GERD (gastroesophageal reflux disease)   . Neuromuscular disorder    CTS right hand  . COPD (chronic obstructive pulmonary disease)     uses advair and albuterol  . H/O hiatal hernia   . Retinal detachment of right eye with single break     Past Surgical History  Procedure Laterality Date  . Joint replacement      bil hip arthroplasties  . Hemorroidectomy    . Popliteal venous aneurysm repair      History reviewed. No pertinent family history. Social History:  reports that he has quit smoking. He has never used smokeless tobacco. He reports that  drinks alcohol. He reports that he does not use illicit drugs.  Allergies:  Allergies  Allergen Reactions  . Codeine Nausea And Vomiting    Medications Prior to Admission  Medication Sig Dispense Refill  . albuterol (PROVENTIL) (5 MG/ML) 0.5% nebulizer solution Take 2.5 mg by nebulization every 6 (six) hours as needed for wheezing.      Marland Kitchen aspirin 81 MG tablet Take 81 mg by mouth daily.      . calcium-vitamin D (OSCAL WITH D) 500-200 MG-UNIT per tablet Take 1 tablet by mouth daily.      . cyanocobalamin 500 MCG tablet Take 500 mcg by mouth daily.      . diphenhydramine-acetaminophen (TYLENOL PM) 25-500 MG TABS Take 1 tablet by mouth at bedtime as needed.      Marland Kitchen  fish oil-omega-3 fatty acids 1000 MG capsule Take 2 g by mouth daily.      . Fluticasone-Salmeterol (ADVAIR) 500-50 MCG/DOSE AEPB Inhale 1 puff into the lungs every 12 (twelve) hours.      Marland Kitchen glipiZIDE (GLUCOTROL) 5 MG tablet Take 5 mg by mouth 2 (two) times daily before a meal.      . lisinopril (PRINIVIL,ZESTRIL) 5 MG tablet Take 5 mg by mouth daily.      . metoprolol tartrate (LOPRESSOR) 25 MG tablet Take 25 mg by mouth 2 (two) times daily.      . naproxen sodium (ANAPROX) 220 MG tablet Take 220 mg by mouth 2 (two) times daily with a meal.      . omeprazole (PRILOSEC) 40 MG capsule Take 40 mg by mouth daily.        Results for orders placed during the hospital encounter of 11/02/12 (from the past 48 hour(s))  POCT HEMOGLOBIN-HEMACUE      Status: None   Collection Time    11/02/12  7:34 AM      Result Value Range   Hemoglobin 13.2  13.0 - 17.0 g/dL  GLUCOSE, CAPILLARY     Status: None   Collection Time    11/02/12  7:36 AM      Result Value Range   Glucose-Capillary 87  70 - 99 mg/dL    No results found.   Pertinent items are noted in HPI.  Blood pressure 142/70, pulse 68, temperature 98 F (36.7 C), temperature source Oral, resp. rate 18, height 5\' 6"  (1.676 m), weight 74.481 kg (164 lb 3.2 oz), SpO2 98.00%.  General appearance: alert, cooperative and appears stated age Head: Normocephalic, without obvious abnormality Neck: no JVD Resp: clear to auscultation bilaterally Cardio: regular rate and rhythm, S1, S2 normal, no murmur, click, rub or gallop GI: soft, non-tender; bowel sounds normal; no masses,  no organomegaly Extremities: extremities normal, atraumatic, no cyanosis or edema Pulses: 2+ and symmetric Skin: Skin color, texture, turgor normal. No rashes or lesions Neurologic: Grossly normal Incision/Wound: na  Assessment/Plan This has been operated on 2 occasions. This continues to trigger. The injection gave him only temporary relief at the carpal tunnel. He would like to proceed to have the carpal canal released along with surgery on the middle finger. We would recommend a tenosynovectomy and release A-1 pulley and possible excision of 1 limb of the superficialis to the middle finger right hand along with right carpal tunnel release. The pre, peri and post op course are discussed along with risks and complications.  He is aware there is no guarantee with surgery, possibility of infection, recurrence, injury to arteries, nerves and tendons, incomplete relief of symptoms and dystrophy.  This will be scheduled as an outpatient under regional anesthesia.  [name] R 11/02/2012, 9:05 AM

## 2012-11-02 NOTE — Op Note (Signed)
Dictation Number [id number]

## 2012-11-02 NOTE — Brief Op Note (Signed)
11/02/2012  10:30 AM  PATIENT:  [name]  79 y.o. male  PRE-OPERATIVE DIAGNOSIS:  CARPAL TUNNEL SYNDROME RIGHT, RECURRENT STS RIGHT MIDDLE FINGER  POST-OPERATIVE DIAGNOSIS:  CARPAL TUNNEL SYNDROME RIGHT, RECURRENT STS RIGHT  PROCEDURE:  Procedure(s): CARPAL TUNNEL RELEASE (Right) RELEASE TRIGGER FINGER/A-1 PULLEY RIGHT MIDDLE FINGER, POSSIBLE REMOVAL PARTIAL FLEXOR SUPERFICIALIS (Right)  SURGEON:  Surgeon(s) and Role:    * Nicki Reaper, MD - Primary    * Tami Ribas, MD - Assisting  PHYSICIAN ASSISTANT:   ASSISTANTS: K Lenette Rau,MD   ANESTHESIA:   local and regional  EBL:  Total I/O In: 500 [I.V.:500] Out: -   BLOOD ADMINISTERED:none  DRAINS: none   LOCAL MEDICATIONS USED:  MARCAINE     SPECIMEN:  No Specimen  DISPOSITION OF SPECIMEN:  N/A  COUNTS:  YES  TOURNIQUET:   Total Tourniquet Time Documented: Upper Arm (Right) - 40 minutes Total: Upper Arm (Right) - 40 minutes   DICTATION: .Other Dictation: Dictation Number [id number]  PLAN OF CARE: Discharge to home after PACU  PATIENT DISPOSITION:  PACU - hemodynamically stable.

## 2012-11-02 NOTE — Transfer of Care (Signed)
Immediate Anesthesia Transfer of Care Note  Patient: TRAMANE GORUM  Procedure(s) Performed: Procedure(s): CARPAL TUNNEL RELEASE (Right) RELEASE TRIGGER FINGER/A-1 PULLEY RIGHT MIDDLE FINGER, POSSIBLE REMOVAL PARTIAL FLEXOR SUPERFICIALIS (Right)  Patient Location: PACU  Anesthesia Type:MAC and Bier block  Level of Consciousness: awake, alert  and oriented  Airway & Oxygen Therapy: Patient Spontanous Breathing and Patient connected to face mask oxygen  Post-op Assessment: Report given to PACU RN and Post -op Vital signs reviewed and stable  Post vital signs: Reviewed and stable  Complications: No apparent anesthesia complications

## 2012-11-02 NOTE — Anesthesia Preprocedure Evaluation (Signed)
Anesthesia Evaluation  Patient identified by MRN, date of birth, ID band Patient awake    Reviewed: Allergy & Precautions, H&P , NPO status , Patient's Chart, lab work & pertinent test results  History of Anesthesia Complications Negative for: history of anesthetic complications  Airway Mallampati: II TM Distance: >3 FB Neck ROM: Full    Dental  (+) Partial Lower, Partial Upper and Dental Advisory Given   Pulmonary COPD COPD inhaler, former smoker,  breath sounds clear to auscultation  Pulmonary exam normal       Cardiovascular hypertension, Pt. on medications and Pt. on home beta blockers Rhythm:Regular Rate:Normal     Neuro/Psych negative neurological ROS  negative psych ROS   GI/Hepatic Neg liver ROS, hiatal hernia, GERD-  Medicated and Controlled,  Endo/Other  diabetes (glu 87), Well Controlled, Type 2, Oral Hypoglycemic Agents  Renal/GU negative Renal ROS     Musculoskeletal   Abdominal   Peds  Hematology negative hematology ROS (+)   Anesthesia Other Findings   Reproductive/Obstetrics                           Anesthesia Physical Anesthesia Plan  ASA: III  Anesthesia Plan: MAC and Bier Block   Post-op Pain Management:    Induction:   Airway Management Planned: Natural Airway and Simple Face Mask  Additional Equipment:   Intra-op Plan:   Post-operative Plan:   Informed Consent: I have reviewed the patients History and Physical, chart, labs and discussed the procedure including the risks, benefits and alternatives for the proposed anesthesia with the patient or authorized representative who has indicated his/her understanding and acceptance.   Dental advisory given  Plan Discussed with: CRNA and Surgeon  Anesthesia Plan Comments: (Plan routine monitors, MAC with IV Lidocaine)        Anesthesia Quick Evaluation

## 2012-11-02 NOTE — Anesthesia Postprocedure Evaluation (Signed)
  Anesthesia Post-op Note  Patient: [name]  Procedure(s) Performed: Procedure(s): CARPAL TUNNEL RELEASE (Right) RELEASE TRIGGER FINGER/A-1 PULLEY RIGHT MIDDLE FINGER, POSSIBLE REMOVAL PARTIAL FLEXOR SUPERFICIALIS (Right)  Patient Location: PACU  Anesthesia Type:Bier block  Level of Consciousness: awake, alert , oriented and patient cooperative  Airway and Oxygen Therapy: Patient Spontanous Breathing  Post-op Pain: none  Post-op Assessment: Post-op Vital signs reviewed, Patient's Cardiovascular Status Stable, Respiratory Function Stable, Patent Airway, No signs of Nausea or vomiting and Pain level controlled  Post-op Vital Signs: Reviewed and stable  Complications: No apparent anesthesia complications

## 2012-11-02 NOTE — Anesthesia Procedure Notes (Signed)
Anesthesia Regional Block:  Bier block (IV Regional)  Pre-Anesthetic Checklist: ,, timeout performed, Correct Patient, Correct Site, Correct Laterality, Correct Procedure,, site marked, surgical consent,, at surgeon's request  Laterality: Right  Prep: alcohol swabs       Needles:  Injection technique: Single-shot  Needle Type: Other     Needle Length: 5cm 5 cm Needle Gauge: 22 and 22 G    Additional Needles: Bier block (IV Regional) Narrative:   Performed by: Personally   Additional Notes: Bier Block Right arm in standard fashion, 50 cc 0.5% pf lidocaine  Bier block (IV Regional)

## 2012-11-04 ENCOUNTER — Encounter (HOSPITAL_BASED_OUTPATIENT_CLINIC_OR_DEPARTMENT_OTHER): Payer: Self-pay | Admitting: Orthopedic Surgery

## 2012-11-05 NOTE — Op Note (Deleted)
NAME:  [name], [name]              ACCOUNT NO.:  [id number]  MEDICAL RECORD NO.:  [id number]  LOCATION:                               FACILITY:  [facility]  PHYSICIAN:  Savino Whisenant, M.D.       DATE OF BIRTH:  06/10/1932  DATE OF PROCEDURE:  11/02/2012 DATE OF DISCHARGE:  11/02/2012                              OPERATIVE REPORT   PREOPERATIVE DIAGNOSIS:  Recurrent stenosing tenosynovitis, right middle finger with carpal tunnel syndrome, right hand.  POSTOPERATIVE DIAGNOSIS:  Recurrent stenosing tenosynovitis, right middle finger with carpal tunnel syndrome, right hand.  OPERATIONS:  Decompression of right median nerve at the wrist with re- release of stenosing tenosynovitis, with excision of ulnar limb with a superficialis tendon, right middle finger.  SURGEON:  Nickolai Rinks, MD  ASSISTANT:  Kevin Guiseppe Flanagan, MD  ANESTHESIA:  Upper arm IV regional sedation and local infiltration with Marcaine.  ANESTHESIOLOGIST:  Dr. Jackson.  HISTORY:  The patient is a 80-year-old male with a history of excision of Dupuytren contracture to releases of a stenosing tenosynovitis with tenosynovectomy, flexor tendons to the right middle finger in the past. He has continued triggering and he is desirous of carpal tunnel release, nerve conductions positive along with re-release, possible excision of 1 limb of the superficialis ring of his right middle finger.  He is aware of risks and complications including infection, recurrence of injury to arteries, nerves, tendons, incomplete relief of symptoms, dystrophy.  In the preoperative area, the patient was seen.  The extremity was marked by both the patient and surgeon.  Antibiotic given.  DESCRIPTION OF PROCEDURE:  The patient was brought to the operating room where an upper arm IV regional anesthetic was carried out without difficulty.  He was prepped using ChloraPrep in supine position, right arm free.  A 3-minute dry time was allowed.  Time-out taken,  confirming the patient, procedure.  A release was performed for the carpal tunnel first longitudinal along the palm carried down through subcutaneous tissue.  Bleeders were electrocauterized bipolar.  The branches of the superficial palmar nerves were identified and protected.  The palmar fascia was split.  Superficial palmar arch identified.  The flexor tendon to the ring and little finger identified to the ulnar side of median nerve.  Carpal retinaculum was incised with sharp dissection. Right angle and Sewell retractor were placed between skin and forearm fascia.  The fascia was released for approximately a centimeter and half proximal to the wrist crease under direct vision.  Canal was explored. Air compression to the nerve was apparent.  Motor branch was noted to enter into the muscle.  No further lesions were identified.  The wound was irrigated and closed with interrupted 4-0 Vicryl Rapide sutures. The old incision was then used for the release of the right middle finger A1 pulley, carried down through subcutaneous tissue.  A large amount of scar was immediately present over the entire flexor sheath, which had been reconstituted.  The neurovascular bundles were identified radially and ulnarly.  The dissection carried to the A1 pulley.  This was excised.  The ulnar limb of the superficialis was noted, triggering was still evident.  This was followed distally.    A small incision made in the ulnar aspect of the C1 pulley area and this was opened, allowing visualization of the 2 limbs of the superficialis.  The ulnar limb was then transected proximally in an oblique manner, delivered distally through the incision and the sheath distally and the ulnar limb removed distally.  The finger was placed through a full range motion, no further triggering was noted.  The wound was copiously irrigated with saline. The skin was then closed with interrupted 4-0 Vicryl Rapide sutures. Local  infiltration with 0.25% Marcaine without epinephrine was given, 10 mL were used.  A sterile compressive dressing was applied.  The tourniquet deflated.  All fingers pinked.  The dressing completed, and he was taken to the recovery room for observation in satisfactory condition.  He will be discharged home to return in 1 week.          ______________________________ Eleaner Dibartolo, M.D.     GK/MEDQ  D:  11/02/2012  T:  11/03/2012  Job:  [id number] 

## 2012-11-05 NOTE — Op Note (Signed)
NAMEDEMONT, LINFORD              ACCOUNT NO.:  [id number]  MEDICAL RECORD NO.:  [id number]  LOCATION:                               FACILITY:  [facility]  PHYSICIAN:  Cindee Salt, M.D.       DATE OF BIRTH:  Dec 09, 1931  DATE OF PROCEDURE:  11/02/2012 DATE OF DISCHARGE:  11/02/2012                              OPERATIVE REPORT   PREOPERATIVE DIAGNOSIS:  Recurrent stenosing tenosynovitis, right middle finger with carpal tunnel syndrome, right hand.  POSTOPERATIVE DIAGNOSIS:  Recurrent stenosing tenosynovitis, right middle finger with carpal tunnel syndrome, right hand.  OPERATIONS:  Decompression of right median nerve at the wrist with re- release of stenosing tenosynovitis, with excision of ulnar limb with a superficialis tendon, right middle finger.  SURGEON:  Cindee Salt, MD  ASSISTANT:  Betha Loa, MD  ANESTHESIA:  Upper arm IV regional sedation and local infiltration with Marcaine.  ANESTHESIOLOGIST:  Dr. Jean Rosenthal.  HISTORY:  The patient is a 79 year old male with a history of excision of Dupuytren contracture to releases of a stenosing tenosynovitis with tenosynovectomy, flexor tendons to the right middle finger in the past. He has continued triggering and he is desirous of carpal tunnel release, nerve conductions positive along with re-release, possible excision of 1 limb of the superficialis ring of his right middle finger.  He is aware of risks and complications including infection, recurrence of injury to arteries, nerves, tendons, incomplete relief of symptoms, dystrophy.  In the preoperative area, the patient was seen.  The extremity was marked by both the patient and surgeon.  Antibiotic given.  DESCRIPTION OF PROCEDURE:  The patient was brought to the operating room where an upper arm IV regional anesthetic was carried out without difficulty.  He was prepped using ChloraPrep in supine position, right arm free.  A 3-minute dry time was allowed.  Time-out taken,  confirming the patient, procedure.  A release was performed for the carpal tunnel first longitudinal along the palm carried down through subcutaneous tissue.  Bleeders were electrocauterized bipolar.  The branches of the superficial palmar nerves were identified and protected.  The palmar fascia was split.  Superficial palmar arch identified.  The flexor tendon to the ring and little finger identified to the ulnar side of median nerve.  Carpal retinaculum was incised with sharp dissection. Right angle and Sewell retractor were placed between skin and forearm fascia.  The fascia was released for approximately a centimeter and half proximal to the wrist crease under direct vision.  Canal was explored. Air compression to the nerve was apparent.  Motor branch was noted to enter into the muscle.  No further lesions were identified.  The wound was irrigated and closed with interrupted 4-0 Vicryl Rapide sutures. The old incision was then used for the release of the right middle finger A1 pulley, carried down through subcutaneous tissue.  A large amount of scar was immediately present over the entire flexor sheath, which had been reconstituted.  The neurovascular bundles were identified radially and ulnarly.  The dissection carried to the A1 pulley.  This was excised.  The ulnar limb of the superficialis was noted, triggering was still evident.  This was followed distally.  A small incision made in the ulnar aspect of the C1 pulley area and this was opened, allowing visualization of the 2 limbs of the superficialis.  The ulnar limb was then transected proximally in an oblique manner, delivered distally through the incision and the sheath distally and the ulnar limb removed distally.  The finger was placed through a full range motion, no further triggering was noted.  The wound was copiously irrigated with saline. The skin was then closed with interrupted 4-0 Vicryl Rapide sutures. Local  infiltration with 0.25% Marcaine without epinephrine was given, 10 mL were used.  A sterile compressive dressing was applied.  The tourniquet deflated.  All fingers pinked.  The dressing completed, and he was taken to the recovery room for observation in satisfactory condition.  He will be discharged home to return in 1 week.          ______________________________ Cindee Salt, M.D.     GK/MEDQ  D:  11/02/2012  T:  11/03/2012  Job:  [id number]

## 2012-12-08 ENCOUNTER — Other Ambulatory Visit: Payer: Self-pay | Admitting: Orthopedic Surgery

## 2012-12-15 ENCOUNTER — Encounter (HOSPITAL_BASED_OUTPATIENT_CLINIC_OR_DEPARTMENT_OTHER): Payer: Self-pay | Admitting: *Deleted

## 2012-12-15 NOTE — Progress Notes (Signed)
Pt here 4/14-doing well from [facility]-will need istat unless waved-

## 2012-12-21 ENCOUNTER — Encounter (HOSPITAL_BASED_OUTPATIENT_CLINIC_OR_DEPARTMENT_OTHER): Payer: Self-pay

## 2012-12-21 ENCOUNTER — Ambulatory Visit (HOSPITAL_BASED_OUTPATIENT_CLINIC_OR_DEPARTMENT_OTHER)
Admission: RE | Admit: 2012-12-21 | Discharge: 2012-12-21 | Disposition: A | Discharge status: Home / Self Care | Payer: Medicare Other | Source: Ambulatory Visit | Attending: Orthopedic Surgery | Admitting: Orthopedic Surgery

## 2012-12-21 ENCOUNTER — Encounter (HOSPITAL_BASED_OUTPATIENT_CLINIC_OR_DEPARTMENT_OTHER)
Admission: RE | Disposition: A | Discharge status: Home / Self Care | Payer: Self-pay | Source: Ambulatory Visit | Attending: Orthopedic Surgery

## 2012-12-21 ENCOUNTER — Encounter (HOSPITAL_BASED_OUTPATIENT_CLINIC_OR_DEPARTMENT_OTHER): Payer: Self-pay | Admitting: Anesthesiology

## 2012-12-21 ENCOUNTER — Ambulatory Visit (HOSPITAL_BASED_OUTPATIENT_CLINIC_OR_DEPARTMENT_OTHER): Payer: Medicare Other | Admitting: Anesthesiology

## 2012-12-21 DIAGNOSIS — G5600 Carpal tunnel syndrome, unspecified upper limb: Secondary | ICD-10-CM | POA: Insufficient documentation

## 2012-12-21 DIAGNOSIS — Z791 Long term (current) use of non-steroidal anti-inflammatories (NSAID): Secondary | ICD-10-CM | POA: Insufficient documentation

## 2012-12-21 DIAGNOSIS — J4489 Other specified chronic obstructive pulmonary disease: Secondary | ICD-10-CM | POA: Insufficient documentation

## 2012-12-21 DIAGNOSIS — I10 Essential (primary) hypertension: Secondary | ICD-10-CM | POA: Insufficient documentation

## 2012-12-21 DIAGNOSIS — Z7982 Long term (current) use of aspirin: Secondary | ICD-10-CM | POA: Insufficient documentation

## 2012-12-21 DIAGNOSIS — Z885 Allergy status to narcotic agent status: Secondary | ICD-10-CM | POA: Insufficient documentation

## 2012-12-21 DIAGNOSIS — IMO0002 Reserved for concepts with insufficient information to code with codable children: Secondary | ICD-10-CM | POA: Insufficient documentation

## 2012-12-21 DIAGNOSIS — E119 Type 2 diabetes mellitus without complications: Secondary | ICD-10-CM | POA: Insufficient documentation

## 2012-12-21 DIAGNOSIS — J449 Chronic obstructive pulmonary disease, unspecified: Secondary | ICD-10-CM | POA: Insufficient documentation

## 2012-12-21 DIAGNOSIS — Z79899 Other long term (current) drug therapy: Secondary | ICD-10-CM | POA: Insufficient documentation

## 2012-12-21 DIAGNOSIS — K219 Gastro-esophageal reflux disease without esophagitis: Secondary | ICD-10-CM | POA: Insufficient documentation

## 2012-12-21 DIAGNOSIS — M6530 Trigger finger, unspecified finger: Secondary | ICD-10-CM | POA: Insufficient documentation

## 2012-12-21 HISTORY — PX: CARPAL TUNNEL RELEASE: SHX101

## 2012-12-21 HISTORY — PX: TRIGGER FINGER RELEASE: SHX641

## 2012-12-21 LAB — GLUCOSE, CAPILLARY: Glucose-Capillary: 153 mg/dL — ABNORMAL HIGH (ref 70–99)

## 2012-12-21 LAB — POCT I-STAT, CHEM 8
Chloride: 104 mEq/L (ref 96–112)
Glucose, Bld: 125 mg/dL — ABNORMAL HIGH (ref 70–99)
HCT: 38 % — ABNORMAL LOW (ref 39.0–52.0)
Hemoglobin: 12.9 g/dL — ABNORMAL LOW (ref 13.0–17.0)
Potassium: 4.3 mEq/L (ref 3.5–5.1)
Sodium: 139 mEq/L (ref 135–145)

## 2012-12-21 SURGERY — CARPAL TUNNEL RELEASE
Anesthesia: General | Site: Wrist | Laterality: Left | Wound class: Clean

## 2012-12-21 MED ORDER — LIDOCAINE HCL (CARDIAC) 20 MG/ML IV SOLN
INTRAVENOUS | Status: DC | PRN
  Administered 2012-12-21: 80 mg via INTRAVENOUS

## 2012-12-21 MED ORDER — HYDROMORPHONE HCL PF 1 MG/ML IJ SOLN: 0.2500 mg | INTRAMUSCULAR | Status: DC | PRN

## 2012-12-21 MED ORDER — PROMETHAZINE HCL 25 MG/ML IJ SOLN
6.2500 mg | Freq: Once | INTRAMUSCULAR | Status: AC
  Administered 2012-12-21: 6.25 mg via INTRAVENOUS

## 2012-12-21 MED ORDER — FENTANYL CITRATE 0.05 MG/ML IJ SOLN
INTRAMUSCULAR | Status: DC | PRN
  Administered 2012-12-21: 100 ug via INTRAVENOUS

## 2012-12-21 MED ORDER — CHLORHEXIDINE GLUCONATE 4 % EX LIQD: 60.0000 mL | Freq: Once | CUTANEOUS | Status: DC

## 2012-12-21 MED ORDER — EPHEDRINE SULFATE 50 MG/ML IJ SOLN
INTRAMUSCULAR | Status: DC | PRN
  Administered 2012-12-21: 10 mg via INTRAVENOUS

## 2012-12-21 MED ORDER — DEXTROSE 5 % IV SOLN
INTRAVENOUS | Status: DC | PRN
  Administered 2012-12-21: 09:00:00 via INTRAVENOUS

## 2012-12-21 MED ORDER — 0.9 % SODIUM CHLORIDE (POUR BTL) OPTIME
TOPICAL | Status: DC | PRN
  Administered 2012-12-21: 1000 mL

## 2012-12-21 MED ORDER — BUPIVACAINE HCL (PF) 0.25 % IJ SOLN
INTRAMUSCULAR | Status: DC | PRN
  Administered 2012-12-21: 7 mL

## 2012-12-21 MED ORDER — OXYCODONE HCL 5 MG/5ML PO SOLN: 5.0000 mg | Freq: Once | ORAL | Status: DC | PRN

## 2012-12-21 MED ORDER — ONDANSETRON HCL 4 MG/2ML IJ SOLN
INTRAMUSCULAR | Status: DC | PRN
  Administered 2012-12-21: 4 mg via INTRAVENOUS

## 2012-12-21 MED ORDER — LACTATED RINGERS IV SOLN
INTRAVENOUS | Status: DC
  Administered 2012-12-21 (×3): via INTRAVENOUS

## 2012-12-21 MED ORDER — TRAMADOL HCL 50 MG PO TABS: 50.0000 mg | ORAL_TABLET | Freq: Four times a day (QID) | ORAL | Status: AC | PRN

## 2012-12-21 MED ORDER — PROPOFOL 10 MG/ML IV BOLUS
INTRAVENOUS | Status: DC | PRN
  Administered 2012-12-21: 100 mg via INTRAVENOUS

## 2012-12-21 MED ORDER — OXYCODONE HCL 5 MG PO TABS: 5.0000 mg | ORAL_TABLET | Freq: Once | ORAL | Status: DC | PRN

## 2012-12-21 MED ORDER — CEFAZOLIN SODIUM-DEXTROSE 2-3 GM-% IV SOLR
2.0000 g | INTRAVENOUS | Status: AC
  Administered 2012-12-21: 2 g via INTRAVENOUS

## 2012-12-21 MED ORDER — ONDANSETRON HCL 4 MG/2ML IJ SOLN: 4.0000 mg | Freq: Once | INTRAMUSCULAR | Status: DC | PRN

## 2012-12-21 SURGICAL SUPPLY — 48 items
BANDAGE COBAN STERILE 2 (GAUZE/BANDAGES/DRESSINGS) IMPLANT
BANDAGE GAUZE ELAST BULKY 4 IN (GAUZE/BANDAGES/DRESSINGS) ×3 IMPLANT
BLADE SURG 15 STRL LF DISP TIS (BLADE) ×2 IMPLANT
BLADE SURG 15 STRL SS (BLADE) ×2
BNDG CMPR 9X4 STRL LF SNTH (GAUZE/BANDAGES/DRESSINGS) ×1
BNDG COHESIVE 3X5 TAN STRL LF (GAUZE/BANDAGES/DRESSINGS) ×3 IMPLANT
BNDG ESMARK 4X9 LF (GAUZE/BANDAGES/DRESSINGS) ×3 IMPLANT
CHLORAPREP W/TINT 26ML (MISCELLANEOUS) ×3 IMPLANT
CLOTH BEACON ORANGE TIMEOUT ST (SAFETY) ×3 IMPLANT
CORDS BIPOLAR (ELECTRODE) ×3 IMPLANT
COVER MAYO STAND STRL (DRAPES) ×3 IMPLANT
COVER TABLE BACK 60X90 (DRAPES) ×3 IMPLANT
CUFF TOURNIQUET SINGLE 18IN (TOURNIQUET CUFF) ×3 IMPLANT
DECANTER SPIKE VIAL GLASS SM (MISCELLANEOUS) IMPLANT
DRAPE EXTREMITY T 121X128X90 (DRAPE) ×3 IMPLANT
DRAPE SURG 17X23 STRL (DRAPES) ×3 IMPLANT
DRSG KUZMA FLUFF (GAUZE/BANDAGES/DRESSINGS) ×3 IMPLANT
GAUZE XEROFORM 1X8 LF (GAUZE/BANDAGES/DRESSINGS) ×3 IMPLANT
GLOVE BIO SURGEON STRL SZ 6 (GLOVE) ×3 IMPLANT
GLOVE BIO SURGEON STRL SZ 6.5 (GLOVE) ×3 IMPLANT
GLOVE BIO SURGEON STRL SZ7.5 (GLOVE) ×3 IMPLANT
GLOVE BIOGEL PI IND STRL 6.5 (GLOVE) ×2 IMPLANT
GLOVE BIOGEL PI IND STRL 7.5 (GLOVE) ×2 IMPLANT
GLOVE BIOGEL PI IND STRL 8 (GLOVE) ×2 IMPLANT
GLOVE BIOGEL PI IND STRL 8.5 (GLOVE) ×2 IMPLANT
GLOVE BIOGEL PI INDICATOR 6.5 (GLOVE) ×1
GLOVE BIOGEL PI INDICATOR 7.5 (GLOVE) ×1
GLOVE BIOGEL PI INDICATOR 8 (GLOVE) ×1
GLOVE BIOGEL PI INDICATOR 8.5 (GLOVE) ×1
GLOVE EXAM NITRILE LRG STRL (GLOVE) ×3 IMPLANT
GLOVE SURG ORTHO 8.0 STRL STRW (GLOVE) ×3 IMPLANT
GOWN BRE IMP PREV XXLGXLNG (GOWN DISPOSABLE) IMPLANT
GOWN PREVENTION PLUS XLARGE (GOWN DISPOSABLE) IMPLANT
NEEDLE 27GAX1X1/2 (NEEDLE) ×3 IMPLANT
NS IRRIG 1000ML POUR BTL (IV SOLUTION) ×3 IMPLANT
PACK BASIN DAY SURGERY FS (CUSTOM PROCEDURE TRAY) ×3 IMPLANT
PAD CAST 3X4 CTTN HI CHSV (CAST SUPPLIES) ×2 IMPLANT
PADDING CAST ABS 4INX4YD NS (CAST SUPPLIES) ×1
PADDING CAST ABS COTTON 4X4 ST (CAST SUPPLIES) ×2 IMPLANT
PADDING CAST COTTON 3X4 STRL (CAST SUPPLIES) ×1
SPONGE GAUZE 4X4 12PLY (GAUZE/BANDAGES/DRESSINGS) ×3 IMPLANT
STOCKINETTE 4X48 STRL (DRAPES) ×3 IMPLANT
SUT VICRYL 4-0 PS2 18IN ABS (SUTURE) IMPLANT
SUT VICRYL RAPIDE 4/0 PS 2 (SUTURE) ×3 IMPLANT
SYR BULB 3OZ (MISCELLANEOUS) ×3 IMPLANT
SYR CONTROL 10ML LL (SYRINGE) ×3 IMPLANT
TOWEL OR 17X24 6PK STRL BLUE (TOWEL DISPOSABLE) ×3 IMPLANT
UNDERPAD 30X30 INCONTINENT (UNDERPADS AND DIAPERS) ×3 IMPLANT

## 2012-12-21 NOTE — Anesthesia Preprocedure Evaluation (Signed)
Anesthesia Evaluation  Patient identified by MRN, date of birth, ID band Patient awake    Reviewed: Allergy & Precautions, H&P , NPO status , Patient's Chart, lab work & pertinent test results, reviewed documented beta blocker date and time   Airway Mallampati: I TM Distance: >3 FB Neck ROM: Full    Dental  (+) Teeth Intact, Partial Lower and Partial Upper   Pulmonary  breath sounds clear to auscultation        Cardiovascular hypertension, Pt. on home beta blockers Rhythm:Regular     Neuro/Psych    GI/Hepatic   Endo/Other  diabetes, Well Controlled, Type 2, Oral Hypoglycemic Agents  Renal/GU      Musculoskeletal   Abdominal   Peds  Hematology   Anesthesia Other Findings   Reproductive/Obstetrics                           Anesthesia Physical Anesthesia Plan  ASA: III  Anesthesia Plan: General   Post-op Pain Management:    Induction: Intravenous  Airway Management Planned: LMA  Additional Equipment:   Intra-op Plan:   Post-operative Plan: Extubation in OR  Informed Consent: I have reviewed the patients History and Physical, chart, labs and discussed the procedure including the risks, benefits and alternatives for the proposed anesthesia with the patient or authorized representative who has indicated his/her understanding and acceptance.   Dental advisory given  Plan Discussed with: Anesthesiologist, Surgeon and CRNA  Anesthesia Plan Comments:         Anesthesia Quick Evaluation

## 2012-12-21 NOTE — Anesthesia Procedure Notes (Signed)
Procedure Name: LMA Insertion Date/Time: 12/21/2012 8:48 AM Performed by: Gar Gibbon Pre-anesthesia Checklist: Patient identified, Emergency Drugs available, Suction available and Patient being monitored Patient Re-evaluated:Patient Re-evaluated prior to inductionOxygen Delivery Method: Circle System Utilized Preoxygenation: Pre-oxygenation with 100% oxygen Intubation Type: IV induction Ventilation: Mask ventilation without difficulty LMA: LMA inserted LMA Size: 4.0 Number of attempts: 1 Airway Equipment and Method: bite block Placement Confirmation: positive ETCO2 Tube secured with: Tape Dental Injury: Teeth and Oropharynx as per pre-operative assessment

## 2012-12-21 NOTE — Transfer of Care (Signed)
Immediate Anesthesia Transfer of Care Note  Patient: [name]  Procedure(s) Performed: Procedure(s): CARPAL TUNNEL RELEASE (Left) RELEASE TRIGGER FINGER/A-1 PULLEY LEFT MIDDLE FINGER (Left)  Patient Location: PACU  Anesthesia Type:General  Level of Consciousness: awake and patient cooperative  Airway & Oxygen Therapy: Patient Spontanous Breathing and Patient connected to face mask oxygen  Post-op Assessment: Report given to PACU RN and Post -op Vital signs reviewed and stable  Post vital signs: Reviewed and stable  Complications: No apparent anesthesia complications

## 2012-12-21 NOTE — Brief Op Note (Signed)
12/21/2012  9:23 AM  PATIENT:  [name]  79 y.o. male  PRE-OPERATIVE DIAGNOSIS:  CARPAL TUNNEL SYNDROME LEFT, Stenosing Tenosynovitis LEFT MIDDLE FINGER  POST-OPERATIVE DIAGNOSIS:  CARPAL TUNNEL SYNDROME LEFT, StenosingTenosynovitis LEFT MIDDLE   PROCEDURE:  Procedure(s): CARPAL TUNNEL RELEASE (Left) RELEASE TRIGGER FINGER/A-1 PULLEY LEFT MIDDLE FINGER (Left)  SURGEON:  Surgeon(s) and Role:    * Nicki Reaper, MD - Primary    * Tami Ribas, MD - Assisting  PHYSICIAN ASSISTANT:   ASSISTANTS: K Kiyla Ringler,MD   ANESTHESIA:   local and regional  EBL:  Total I/O In: 1050 [I.V.:1050] Out: -   BLOOD ADMINISTERED:none  DRAINS: none   LOCAL MEDICATIONS USED:  MARCAINE     SPECIMEN:  No Specimen and Excision  DISPOSITION OF SPECIMEN:  N/A  COUNTS:  YES  TOURNIQUET:   Total Tourniquet Time Documented: Forearm (Left) - 16 minutes Total: Forearm (Left) - 16 minutes   DICTATION: .Other Dictation: Dictation Number [id number]  PLAN OF CARE: Discharge to home after PACU  PATIENT DISPOSITION:  PACU - hemodynamically stable.

## 2012-12-21 NOTE — Op Note (Signed)
Dictation Number [id number]

## 2012-12-21 NOTE — H&P (Signed)
[name] is a 79 year-old right-hand dominant male who has been seen in the past by Dr. Cleophas Dunker and Dr. Mina Marble for triggering of his right middle finger. He has had two operations on this.  He continues to complain of it catching.  He is complaining, however, of numbness, tingling, pain in his hand and wrist.  This has been going on for the past several years.  He had nerve conductions a year ago by Dr. Johna Roles revealing a peripheral neuropathy with carpal tunnel syndrome bilaterally, motor delays of 6.3 on the left, 8.8 on the right, sensory delay 2.8 on the left, and 2.5 on the right.  He has diminution of his ulnar nerve at the elbow to 41/42.  He is complaining of numbness and tingling of all of his fingers.  He has history of diabetes, no history of thyroid problems, arthritis or gout. He is awakened 4-5 out of 7 nights. He has no history of injury to the hand or neck.  Use of his hands increases this for him. He states that sitting down, putting his arms on arm rest will cause his hands to get numb.   He is post  right carpal tunnel release and is doing very well, along with his release of the stenosing tenosynovitis right middle finger with excisional one limb of his superficialis.  He has full flexion/extension.  He is complaining of  his left side with the carpal tunnel.  Nerve conductions are positive.  He has also developed triggering of his left middle finger.  He would like to proceed to have these surgically released. He is aware of risks and complications.  He is scheduled for left carpal tunnel release with release A-1 pulley of left middle finger possible excision of one limb of the superficialis.   ALLERGIES:   He is allergic to some pain medications, but cannot recall what they are.  MEDICATIONS:    Glipizide, Advair, albuterol, lisinopril, metoprolol, omeprazole, Aleve, aspirin, Tylenol, vitamin D3, B-12, B-6, cranberry, cinnamon, fish oil, potassium, biotin, niacin and  gabapentin.  SURGICAL HISTORY:     Hip surgery x 4, hernia repair, hand surgery x 2.  FAMILY MEDICAL HISTORY:    Positive for high blood pressure and arthritis.    SOCIAL HISTORY:     He does not smoke or drink. He is married.    REVIEW OF SYSTEMS:    Positive for glasses, ringing in his ears, high blood pressure, asthma, cough, otherwise negative. [name] is an 79 y.o. male.   Chief Complaint: CTS left sts lmf HPI: see above  Past Medical History  Diagnosis Date  . Diabetes mellitus without complication     NIDDM  . Hypertension   . GERD (gastroesophageal reflux disease)   . Neuromuscular disorder     CTS right hand  . COPD (chronic obstructive pulmonary disease)     uses advair and albuterol  . H/O hiatal hernia   . Retinal detachment of right eye with single break     Past Surgical History  Procedure Laterality Date  . Joint replacement      bil hip arthroplasties  . Hemorroidectomy    . Popliteal venous aneurysm repair    . Carpal tunnel release Right 11/02/2012    Procedure: CARPAL TUNNEL RELEASE;  Surgeon: Nicki Reaper, MD;  Location: [facility];  Service: Orthopedics;  Laterality: Right;  . Trigger finger release Right 11/02/2012    Procedure: RELEASE TRIGGER FINGER/A-1 PULLEY RIGHT MIDDLE  FINGER, POSSIBLE REMOVAL PARTIAL FLEXOR SUPERFICIALIS;  Surgeon: Nicki Reaper, MD;  Location: [facility];  Service: Orthopedics;  Laterality: Right;    History reviewed. No pertinent family history. Social History:  reports that he has quit smoking. He has never used smokeless tobacco. He reports that  drinks alcohol. He reports that he does not use illicit drugs.  Allergies:  Allergies  Allergen Reactions  . Codeine Nausea And Vomiting    Medications Prior to Admission  Medication Sig Dispense Refill  . aspirin 81 MG tablet Take 81 mg by mouth daily.      . cyanocobalamin 500 MCG tablet Take 500 mcg by mouth daily.      .  diphenhydramine-acetaminophen (TYLENOL PM) 25-500 MG TABS Take 1 tablet by mouth at bedtime as needed.      . fish oil-omega-3 fatty acids 1000 MG capsule Take 2 g by mouth daily.      . Fluticasone-Salmeterol (ADVAIR) 500-50 MCG/DOSE AEPB Inhale 1 puff into the lungs every 12 (twelve) hours.      Marland Kitchen glipiZIDE (GLUCOTROL) 5 MG tablet Take 5 mg by mouth 2 (two) times daily before a meal.      . lisinopril (PRINIVIL,ZESTRIL) 5 MG tablet Take 5 mg by mouth daily.      . metoprolol tartrate (LOPRESSOR) 25 MG tablet Take 25 mg by mouth 2 (two) times daily.      . naproxen sodium (ANAPROX) 220 MG tablet Take 220 mg by mouth 2 (two) times daily with a meal.      . omeprazole (PRILOSEC) 40 MG capsule Take 40 mg by mouth daily.      Marland Kitchen albuterol (PROVENTIL) (5 MG/ML) 0.5% nebulizer solution Take 2.5 mg by nebulization every 6 (six) hours as needed for wheezing.        Results for orders placed during the hospital encounter of 12/21/12 (from the past 48 hour(s))  POCT I-STAT, CHEM 8     Status: Abnormal   Collection Time    12/21/12  7:46 AM      Result Value Range   Sodium 139  135 - 145 mEq/L   Potassium 4.3  3.5 - 5.1 mEq/L   Chloride 104  96 - 112 mEq/L   BUN 17  6 - 23 mg/dL   Creatinine, Ser 1.61  0.50 - 1.35 mg/dL   Glucose, Bld 096 (*) 70 - 99 mg/dL   Calcium, Ion 0.45  4.09 - 1.30 mmol/L   TCO2 26  0 - 100 mmol/L   Hemoglobin 12.9 (*) 13.0 - 17.0 g/dL   HCT 81.1 (*) 91.4 - 78.2 %    No results found.   Pertinent items are noted in HPI.  Blood pressure 123/63, pulse 68, temperature 98.3 F (36.8 C), temperature source Oral, resp. rate 20, height 5\' 6"  (1.676 m), weight 72.303 kg (159 lb 6.4 oz), SpO2 99.00%.  General appearance: alert, cooperative and appears stated age Head: Normocephalic, without obvious abnormality Neck: no JVD Resp: clear to auscultation bilaterally Cardio: regular rate and rhythm, S1, S2 normal, no murmur, click, rub or gallop GI: soft, non-tender; bowel  sounds normal; no masses,  no organomegaly Extremities: extremities normal, atraumatic, no cyanosis or edema Pulses: 2+ and symmetric Skin: Skin color, texture, turgor normal. No rashes or lesions Neurologic: Grossly normal Incision/Wound: na  Assessment/Plan He is scheduled for release of left carpal tunnel with release of stenosing tenosynovitis left middle finger, possible excision of one limb of the superficialis left middle.  This will be done as an outpatient under regional anesthesia.  Makari Portman R 12/21/2012, 8:32 AM

## 2012-12-21 NOTE — Anesthesia Postprocedure Evaluation (Signed)
  Anesthesia Post-op Note  Patient: [name]  Procedure(s) Performed: Procedure(s): CARPAL TUNNEL RELEASE (Left) RELEASE TRIGGER FINGER/A-1 PULLEY LEFT MIDDLE FINGER (Left)  Patient Location: PACU  Anesthesia Type:General  Level of Consciousness: awake, alert  and oriented  Airway and Oxygen Therapy: Patient Spontanous Breathing  Post-op Pain: mild  Post-op Assessment: Post-op Vital signs reviewed  Post-op Vital Signs: Reviewed  Complications: No apparent anesthesia complications

## 2012-12-21 NOTE — Op Note (Signed)
NAMEElita [name] NO.:  [id number]  MEDICAL RECORD NO.:  [id number]  LOCATION:                                 FACILITY:  PHYSICIAN:  Cindee Salt, M.D.            DATE OF BIRTH:  DATE OF PROCEDURE:  12/21/2012 DATE OF DISCHARGE:                              OPERATIVE REPORT   PREOPERATIVE DIAGNOSIS:  Carpal tunnel syndrome, left hand with stenosing tenosynovitis, left middle finger.  POSTOPERATIVE DIAGNOSIS:  Carpal tunnel syndrome, left hand with stenosing tenosynovitis, left middle finger.  OPERATION:  Carpal tunnel release with release A1 pulley, left middle finger, left wrist.  SURGEON:  Cindee Salt, M.D.  ASSISTANT:  Betha Loa, MD  ANESTHESIA:  General with local infiltration.  HISTORY:  The patient is a 79 year old male with a history of carpal tunnel syndrome bilaterally; stenosing tenosynovitis, middle fingers bilaterally.  He has undergone release of his right.  He is admitted now for release on his left.  Pre, peri, and postoperative course have been discussed along with risks and complications.  He is aware that there is no guarantee with surgery; possibility of infection; recurrence of injury to arteries, nerves, tendons; incomplete relief of symptoms, dystrophy.  In preoperative area, the patient is seen.  The extremity marked by both the patient and surgeon.  Antibiotic given.  PROCEDURE:  The patient was brought to the operating room where a general anesthetic was carried out without difficulty.  He was prepped using ChloraPrep, supine position, left arm free.  A tourniquet placed on the forearm was inflated to 250 mmHg after exsanguinating the limb with an Esmarch bandage taking time-out.  Tourniquet was inflated to 250 mmHg.  An oblique incision was made over the A1 pulley of the left middle finger carried down through subcutaneous tissue.  The A1 pulley was identified and found to be thickened.  This was released on its radial  aspect.  Small incision made centrally in A2.  Separate incision was then made in the palm for release of the carpal canal, carried down through subcutaneous tissue.  Palmar fascia was split.  Superficial palmar arch identified.  Flexor tendon of the ring little finger identified to the ulnar side of the median nerve.  The carpal retinaculum was incised with sharp dissection.  A right-angle and Sewall retractor were placed between skin and forearm fascia.  The fascia released for approximately a centimeter and half to 2 under direct vision on the distal forearm.  The canal was explored.  A central takeoff of the motor branch was noted.  The remaining branches of the median nerve were intact.  The compression was noted.  No further lesions were identified.  The wound was irrigated.  The profundus tendon to the middle finger was then tested to be certain that there was no further triggering.  No triggering was noted distally in that he had multiple release on his opposite hand and required excision of 1 limb of the superficialis.  Both wounds were irrigated.  Skin closed with interrupted 4-0 Vicryl Rapide sutures.  A local infiltration  with 0.25% Marcaine without epinephrine was given, approximately 8 mL was used. Sterile compressive dressing was applied.  On deflation of the tourniquet, all fingers immediately pinked.  He was taken to the recovery room for observation in satisfactory condition.  He will be discharged home to return to [facility] in 1 week on Ultram.          ______________________________ Cindee Salt, M.D.     GK/MEDQ  D:  12/21/2012  T:  12/21/2012  Job:  [id number]

## 2012-12-22 ENCOUNTER — Encounter (HOSPITAL_BASED_OUTPATIENT_CLINIC_OR_DEPARTMENT_OTHER): Payer: Self-pay | Admitting: Orthopedic Surgery

## 2015-08-03 ENCOUNTER — Emergency Department (HOSPITAL_BASED_OUTPATIENT_CLINIC_OR_DEPARTMENT_OTHER)
Admission: EM | Admit: 2015-08-03 | Discharge: 2015-08-03 | Disposition: A | Discharge status: Home / Self Care | Payer: Medicare Other | Attending: Emergency Medicine | Admitting: Emergency Medicine

## 2015-08-03 ENCOUNTER — Encounter (HOSPITAL_BASED_OUTPATIENT_CLINIC_OR_DEPARTMENT_OTHER): Payer: Self-pay | Admitting: *Deleted

## 2015-08-03 ENCOUNTER — Emergency Department (HOSPITAL_BASED_OUTPATIENT_CLINIC_OR_DEPARTMENT_OTHER): Payer: Medicare Other

## 2015-08-03 DIAGNOSIS — Z7951 Long term (current) use of inhaled steroids: Secondary | ICD-10-CM | POA: Diagnosis not present

## 2015-08-03 DIAGNOSIS — Z8669 Personal history of other diseases of the nervous system and sense organs: Secondary | ICD-10-CM | POA: Insufficient documentation

## 2015-08-03 DIAGNOSIS — S91112A Laceration without foreign body of left great toe without damage to nail, initial encounter: Secondary | ICD-10-CM | POA: Diagnosis not present

## 2015-08-03 DIAGNOSIS — Z7982 Long term (current) use of aspirin: Secondary | ICD-10-CM | POA: Diagnosis not present

## 2015-08-03 DIAGNOSIS — I10 Essential (primary) hypertension: Secondary | ICD-10-CM | POA: Diagnosis not present

## 2015-08-03 DIAGNOSIS — Y9289 Other specified places as the place of occurrence of the external cause: Secondary | ICD-10-CM | POA: Diagnosis not present

## 2015-08-03 DIAGNOSIS — K219 Gastro-esophageal reflux disease without esophagitis: Secondary | ICD-10-CM | POA: Insufficient documentation

## 2015-08-03 DIAGNOSIS — Z87891 Personal history of nicotine dependence: Secondary | ICD-10-CM | POA: Diagnosis not present

## 2015-08-03 DIAGNOSIS — Y998 Other external cause status: Secondary | ICD-10-CM | POA: Insufficient documentation

## 2015-08-03 DIAGNOSIS — Y9389 Activity, other specified: Secondary | ICD-10-CM | POA: Diagnosis not present

## 2015-08-03 DIAGNOSIS — Z79899 Other long term (current) drug therapy: Secondary | ICD-10-CM | POA: Diagnosis not present

## 2015-08-03 DIAGNOSIS — E119 Type 2 diabetes mellitus without complications: Secondary | ICD-10-CM | POA: Diagnosis not present

## 2015-08-03 DIAGNOSIS — J449 Chronic obstructive pulmonary disease, unspecified: Secondary | ICD-10-CM | POA: Insufficient documentation

## 2015-08-03 DIAGNOSIS — W01198A Fall on same level from slipping, tripping and stumbling with subsequent striking against other object, initial encounter: Secondary | ICD-10-CM | POA: Diagnosis not present

## 2015-08-03 DIAGNOSIS — S92912B Unspecified fracture of left toe(s), initial encounter for open fracture: Secondary | ICD-10-CM

## 2015-08-03 DIAGNOSIS — S92412B Displaced fracture of proximal phalanx of left great toe, initial encounter for open fracture: Secondary | ICD-10-CM | POA: Diagnosis not present

## 2015-08-03 DIAGNOSIS — S99922A Unspecified injury of left foot, initial encounter: Secondary | ICD-10-CM | POA: Diagnosis present

## 2015-08-03 DIAGNOSIS — S91119A Laceration without foreign body of unspecified toe without damage to nail, initial encounter: Secondary | ICD-10-CM

## 2015-08-03 MED ORDER — AMOXICILLIN-POT CLAVULANATE 875-125 MG PO TABS: 1.0000 | ORAL_TABLET | Freq: Two times a day (BID) | ORAL | Status: AC

## 2015-08-03 NOTE — Discharge Instructions (Signed)
Please have this rechecked on Monday.  Return if any signs of infection such as redness or discharge.   Toe Fracture A toe fracture is a break in one of the toe bones (phalanges). HOME CARE If You Have a Cast:  Do not stick anything inside the cast to scratch your skin.  Check the skin around the cast every day. Tell your doctor about any concerns. Do not put lotion on the skin underneath the cast. You may put lotion on dry skin around the edges of the cast.  Do not put pressure on any part of the cast until it is fully hardened. This may take many hours.  Keep the cast clean and dry. Bathing  Do not take baths, swim, or use a hot tub until your doctor says that you can. Ask your doctor if you can take showers. You may only be allowed to take sponge baths for bathing.  If your doctor says that bathing and showering are okay, cover the cast or bandage (dressing) with a watertight plastic bag to protect it from water. Do not let the cast or bandage get wet. Managing Pain, Stiffness, and Swelling  If you do not have a cast, put ice on the injured area if told by your doctor:  Put ice in a plastic bag.  Place a towel between your skin and the bag.  Leave the ice on for 20 minutes, 2-3 times per day.  Move your toes often to avoid stiffness and to lessen swelling.  Raise (elevate) the injured area above the level of your heart while you are sitting or lying down. Driving  Do not drive or use heavy machinery while taking pain medicine.  Do not drive while wearing a cast on a foot that you use for driving. Activity  Return to your normal activities as told by your doctor. Ask your doctor what activities are safe for you.  Perform exercises daily as told by your doctor or therapist. Safety  Do not use your leg to support your body weight until your doctor says that you can. Use crutches or other tools to help you move around as told by your doctor. General Instructions  If your  toe was taped to a toe that is next to it (buddy taping), follow your doctor's instructions for changing the gauze and tape. Change it more often:  If the gauze and tape get wet. If this happens, dry the space between the toes.  If the gauze and tape are too tight and they cause your toe to become pale or to lose feeling (numb).  Wear a protective shoe as told by your doctor. If you were not given one, wear sturdy shoes that support your foot. Your shoes should not pinch your toes. Your shoes should not fit tightly against your toes.  Do not use any tobacco products, including cigarettes, chewing tobacco, or e-cigarettes. Tobacco can delay bone healing. If you need help quitting, ask your doctor.  Take medicines only as told by your doctor.  Keep all follow-up visits as told by your doctor. This is important. GET HELP IF:  You have a fever.  Your pain medicine is not helping.  Your toe feels cold.  You lose feeling (have numbness) in your toe.  You still have pain after one week of rest and treatment.  You still have pain after your doctor has said that you can start walking again.  You have pain or tingling in your foot, and it  is not going away.  You have loss of feeling in your foot, and it is not going away. GET HELP RIGHT AWAY IF:  You have severe pain.  You have redness or swelling (inflammation) in your toe, and it is getting worse.  You have pain or loss of feeling in your toe, and it is getting worse.  Your toe is blue.   This information is not intended to replace advice given to you by your health care provider. Make sure you discuss any questions you have with your health care provider.   Document Released: 01/07/2008 Document Revised: 12/05/2014 Document Reviewed: 05/17/2014 Elsevier Interactive Patient Education [employer]

## 2015-08-03 NOTE — ED Notes (Signed)
He lost his balance and fell this am while he was getting dressed. He hit his left great toe of cedar chest. His nail is pushed into his toe. He had an xray earlier that shows a broken toe.

## 2015-08-04 NOTE — ED Provider Notes (Signed)
CSN: [id number]     Arrival date & time 08/03/15  1601 History   First MD Initiated Contact with Patient 08/03/15 1728     Chief Complaint  Patient presents with  . Toe Pain     (Consider location/radiation/quality/duration/timing/severity/associated sxs/prior Treatment) HPI  81 y.o. Male with niddm injured toe this am with laceration at nail bed.  Patient seen at [facility] and sent to ed.  Tetanus up to date.  Denies other injury.  States local anesthesia at urgent care center and no pain.    Past Medical History  Diagnosis Date  . Diabetes mellitus without complication (HCC)     NIDDM  . Hypertension   . GERD (gastroesophageal reflux disease)   . Neuromuscular disorder (HCC)     CTS right hand  . COPD (chronic obstructive pulmonary disease) (HCC)     uses advair and albuterol  . H/O hiatal hernia   . Retinal detachment of right eye with single break    Past Surgical History  Procedure Laterality Date  . Joint replacement      bil hip arthroplasties  . Hemorroidectomy    . Popliteal venous aneurysm repair    . Carpal tunnel release Right 11/02/2012    Procedure: CARPAL TUNNEL RELEASE;  Surgeon: Nicki ReaperGary R Kuzma, MD;  Location: [facility];  Service: Orthopedics;  Laterality: Right;  . Trigger finger release Right 11/02/2012    Procedure: RELEASE TRIGGER FINGER/A-1 PULLEY RIGHT MIDDLE FINGER, POSSIBLE REMOVAL PARTIAL FLEXOR SUPERFICIALIS;  Surgeon: Nicki ReaperGary R Kuzma, MD;  Location: [facility];  Service: Orthopedics;  Laterality: Right;  . Carpal tunnel release Left 12/21/2012    Procedure: CARPAL TUNNEL RELEASE;  Surgeon: Nicki ReaperGary R Kuzma, MD;  Location: [facility];  Service: Orthopedics;  Laterality: Left;  . Trigger finger release Left 12/21/2012    Procedure: RELEASE TRIGGER FINGER/A-1 PULLEY LEFT MIDDLE FINGER;  Surgeon: Nicki ReaperGary R Kuzma, MD;  Location: [facility];  Service: Orthopedics;  Laterality: Left;   No family history on  file. Social History  Substance Use Topics  . Smoking status: Former Games developermoker  . Smokeless tobacco: Never Used  . Alcohol Use: Yes     Comment: social    Review of Systems  All other systems reviewed and are negative.     Allergies  Codeine  Home Medications   Prior to Admission medications   Medication Sig Start Date End Date Taking? Authorizing Provider  albuterol (PROVENTIL) (5 MG/ML) 0.5% nebulizer solution Take 2.5 mg by nebulization every 6 (six) hours as needed for wheezing.    Historical Provider, MD  amoxicillin-clavulanate (AUGMENTIN) 875-125 MG tablet Take 1 tablet by mouth every 12 (twelve) hours. 08/03/15   Margarita Grizzleanielle Ekam Bonebrake, MD  aspirin 81 MG tablet Take 81 mg by mouth daily.    Historical Provider, MD  cyanocobalamin 500 MCG tablet Take 500 mcg by mouth daily.    Historical Provider, MD  diphenhydramine-acetaminophen (TYLENOL PM) 25-500 MG TABS Take 1 tablet by mouth at bedtime as needed.    Historical Provider, MD  fish oil-omega-3 fatty acids 1000 MG capsule Take 2 g by mouth daily.    Historical Provider, MD  Fluticasone-Salmeterol (ADVAIR) 500-50 MCG/DOSE AEPB Inhale 1 puff into the lungs every 12 (twelve) hours.    Historical Provider, MD  glipiZIDE (GLUCOTROL) 5 MG tablet Take 5 mg by mouth 2 (two) times daily before a meal.    Historical Provider, MD  lisinopril (PRINIVIL,ZESTRIL) 5 MG tablet Take 5 mg  by mouth daily.    Historical Provider, MD  metoprolol tartrate (LOPRESSOR) 25 MG tablet Take 25 mg by mouth 2 (two) times daily.    Historical Provider, MD  naproxen sodium (ANAPROX) 220 MG tablet Take 220 mg by mouth 2 (two) times daily with a meal.    Historical Provider, MD  omeprazole (PRILOSEC) 40 MG capsule Take 40 mg by mouth daily.    Historical Provider, MD  traMADol (ULTRAM) 50 MG tablet Take 1 tablet (50 mg total) by mouth every 6 (six) hours as needed for pain. 12/21/12   Cindee Salt, MD   BP 153/58 mmHg  Pulse 86  Temp(Src) 98.1 F (36.7 C) (Oral)   Resp 18  Ht  (1.676 m)  Wt 74.39 kg  BMI 26.48 kg/m2  SpO2 98% Physical Exam  Constitutional: He appears well-developed and well-nourished. No distress.  HENT:  Head: Normocephalic and atraumatic.  Musculoskeletal:       Feet:  Laceration through dorsal aspect of great toe proximal to nail bed with nail partially avulsed.  Nursing note and vitals reviewed.   ED Course  .Marland KitchenLaceration Repair Date/Time: 08/04/2015 2:00 PM Performed by: Margarita Grizzle Authorized by: Margarita Grizzle Consent: Verbal consent obtained. Risks and benefits: risks, benefits and alternatives were discussed Body area: lower extremity Location details: left big toe Laceration length: 2 cm Foreign bodies: no foreign bodies Tendon involvement: none Anesthesia: hematoma block (patient remained anesthetized from prior evaluaion) Preparation: Patient was prepped and draped in the usual sterile fashion. Irrigation solution: saline Irrigation method: syringe Amount of cleaning: extensive Debridement: none Skin closure: 5-0 Prolene Number of sutures: 3 Technique: simple Approximation: loose Approximation difficulty: simple Comments: Nail realigned and remains in place with sutures through skin. Discussed high risk with patient due to underlying fracture and diabetes.    (including critical care time) Labs Review Labs Reviewed - No data to display  Imaging Review Dg Toe Great Left  08/03/2015  CLINICAL DATA:  Initial encounter for Pt with left great toe injury today, lost balance while putting on his sock foot impacted with a cedar chest, laceration and pain, actively bleeding upon xrays EXAM: LEFT GREAT TOE COMPARISON:  Great toe films of earlier today. FINDINGS: 1647 hours. Overlying bandage, obscuring bony detail. Soft tissue swelling. Vascular calcifications. Minimally displaced, transverse fracture involving the proximal portion the distal phalanx of the great toe. No definite intra-articular  extension. IMPRESSION: Transverse fracture involving the distal portion the proximal phalanx of the great toe. Vascular calcifications. Electronically Signed   By: Jeronimo Greaves M.D.   On: 08/03/2015 16:51   I have personally reviewed and evaluated these images and lab results as part of my medical decision-making.  The fracture is of the proximal aspect of the distal phalanxe- contrary to impression above.    MDM   Final diagnoses:  Toe laceration, initial encounter  Phalanx fracture, foot, left, open, initial encounter   Discussed high risk with patient due to underlying fracture and diabetes.  Patient started on augmentin and to have recheck Monday- strict return precautions discussed.       Margarita Grizzle, MD 08/04/15 [phone]

## 2017-09-24 IMAGING — CR DG TOE GREAT 2+V*L*
3 series · 3 of 3 positions shown · non-contrast
Comparison: Great toe films of earlier today.

ADDENDUM:
The impression should state 'transverse fracture at the proximal
portion of the distal phalanx of the great toe', as in the body.
CLINICAL DATA: Initial encounter for Pt with left great toe injury
today, lost balance while putting on his sock foot impacted with Rubiela
Delowr chest, laceration and pain, actively bleeding upon xrays

EXAM:
LEFT GREAT TOE

[t toes ap left]
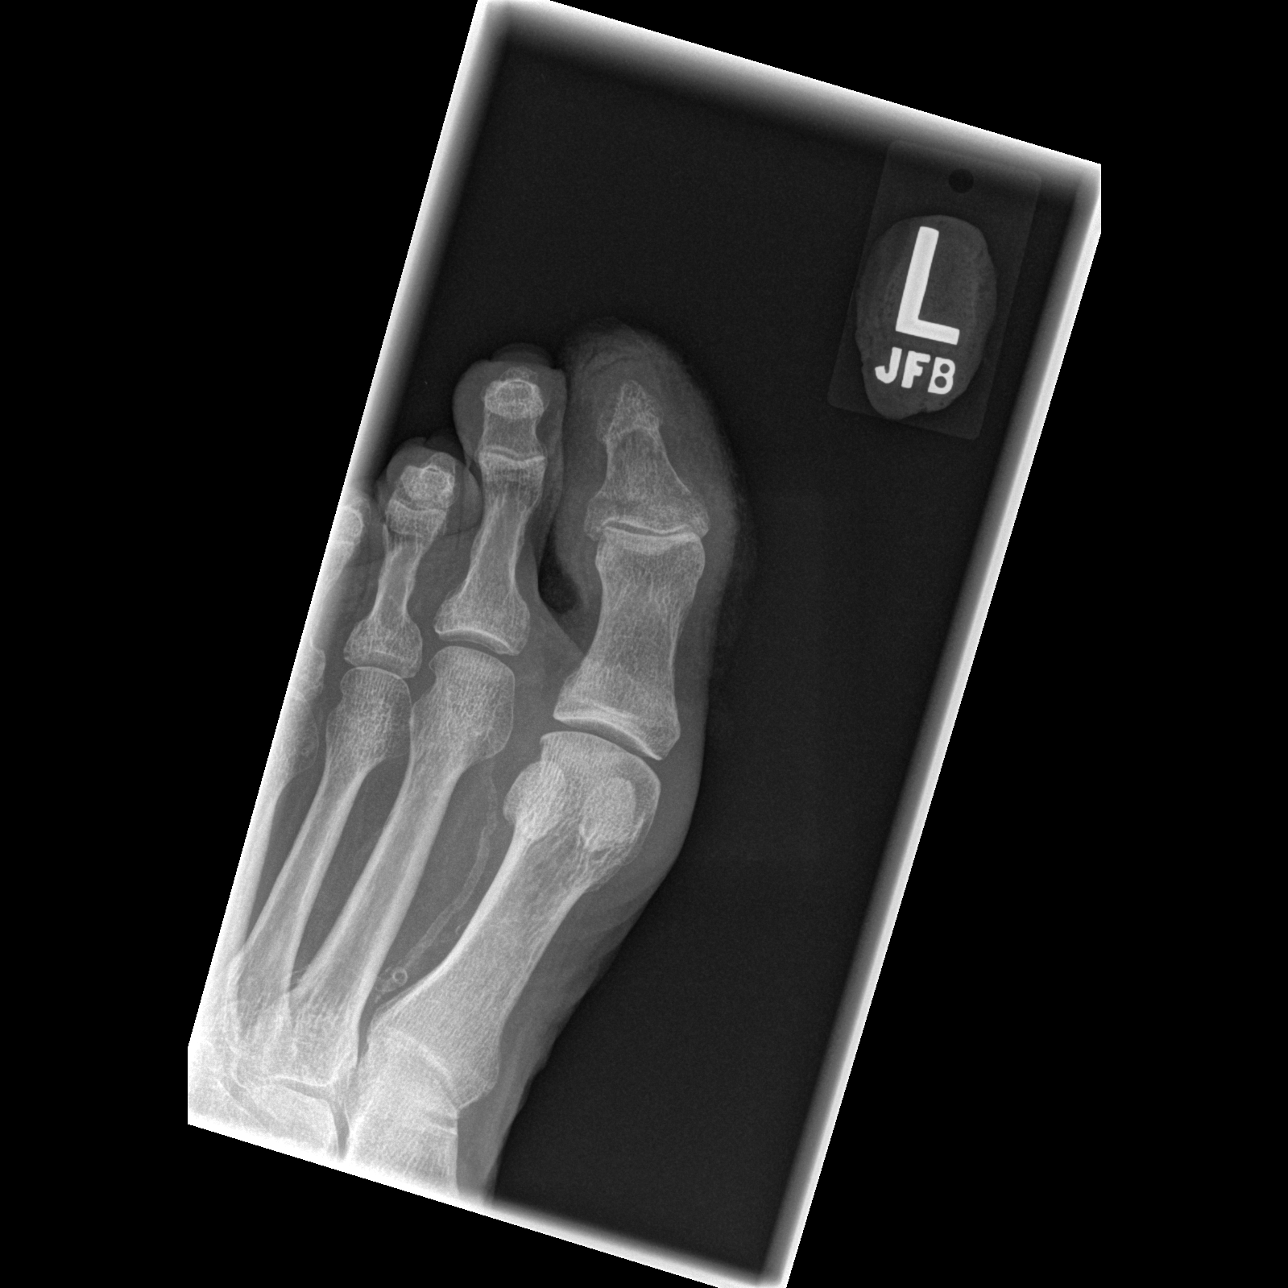

[t toes oblique left]
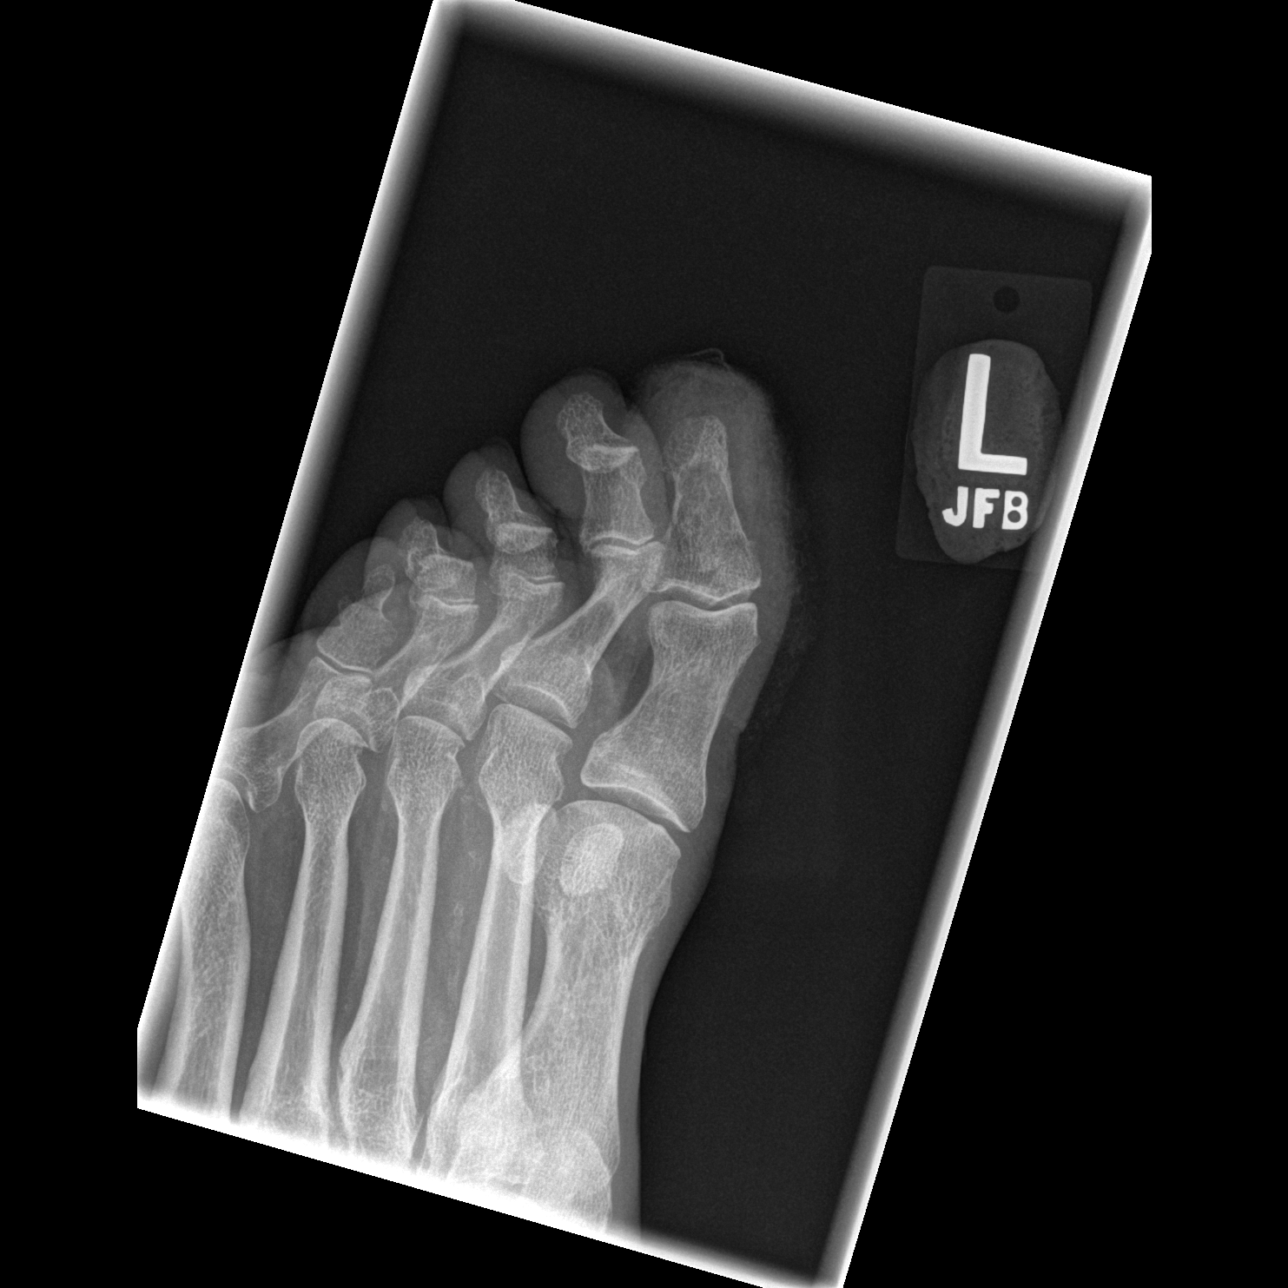

[t toes lateral left]
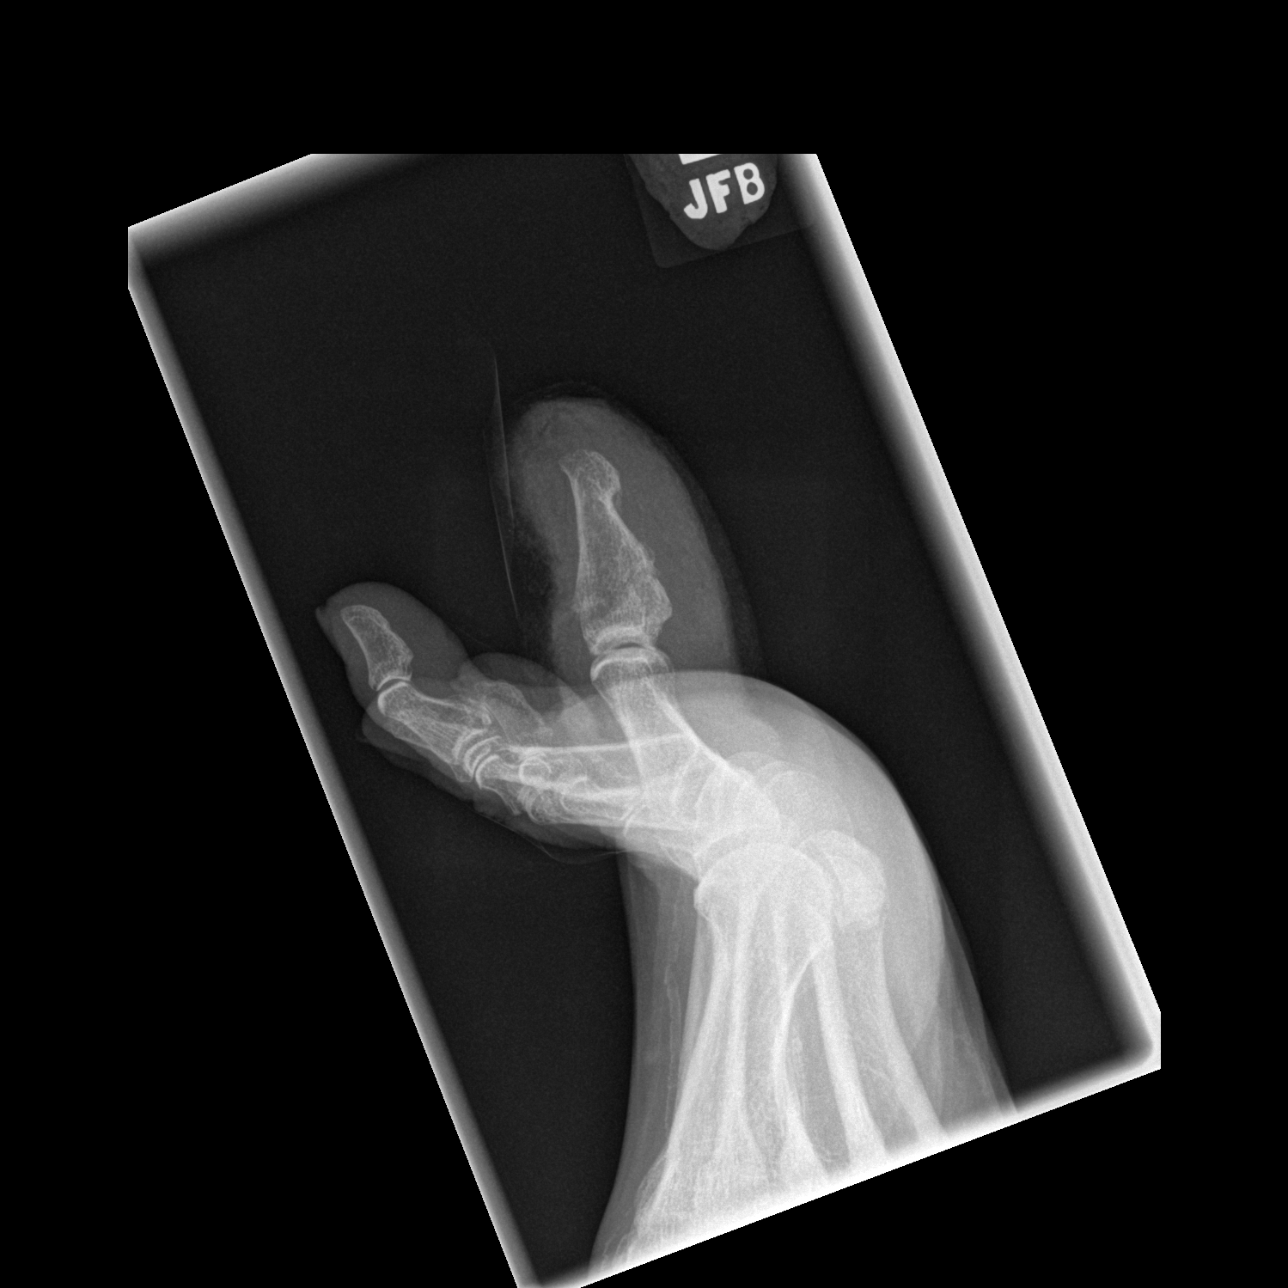

[3 of 3 positions shown; findings below may reference images not displayed]

FINDINGS: 2261 hours. Overlying bandage, obscuring bony detail. Soft tissue
swelling. Vascular calcifications. Minimally displaced, transverse
fracture involving the proximal portion the distal phalanx of the
great toe. No definite intra-articular extension.
IMPRESSION: Transverse fracture involving the distal portion the proximal
phalanx of the great toe.

Vascular calcifications.

## 2018-04-27 ENCOUNTER — Other Ambulatory Visit: Payer: Self-pay

## 2018-04-27 ENCOUNTER — Emergency Department (HOSPITAL_BASED_OUTPATIENT_CLINIC_OR_DEPARTMENT_OTHER)
Admission: EM | Admit: 2018-04-27 | Discharge: 2018-04-27 | Disposition: A | Discharge status: Home / Self Care | Payer: Medicare Other | Attending: Emergency Medicine | Admitting: Emergency Medicine

## 2018-04-27 ENCOUNTER — Encounter (HOSPITAL_BASED_OUTPATIENT_CLINIC_OR_DEPARTMENT_OTHER): Payer: Self-pay | Admitting: *Deleted

## 2018-04-27 DIAGNOSIS — I739 Peripheral vascular disease, unspecified: Secondary | ICD-10-CM | POA: Insufficient documentation

## 2018-04-27 DIAGNOSIS — J449 Chronic obstructive pulmonary disease, unspecified: Secondary | ICD-10-CM | POA: Insufficient documentation

## 2018-04-27 DIAGNOSIS — Z7984 Long term (current) use of oral hypoglycemic drugs: Secondary | ICD-10-CM | POA: Diagnosis not present

## 2018-04-27 DIAGNOSIS — E119 Type 2 diabetes mellitus without complications: Secondary | ICD-10-CM | POA: Insufficient documentation

## 2018-04-27 DIAGNOSIS — Z79899 Other long term (current) drug therapy: Secondary | ICD-10-CM | POA: Insufficient documentation

## 2018-04-27 DIAGNOSIS — I10 Essential (primary) hypertension: Secondary | ICD-10-CM | POA: Diagnosis not present

## 2018-04-27 DIAGNOSIS — Z87891 Personal history of nicotine dependence: Secondary | ICD-10-CM | POA: Insufficient documentation

## 2018-04-27 DIAGNOSIS — L03115 Cellulitis of right lower limb: Secondary | ICD-10-CM | POA: Diagnosis not present

## 2018-04-27 DIAGNOSIS — Z7982 Long term (current) use of aspirin: Secondary | ICD-10-CM | POA: Diagnosis not present

## 2018-04-27 DIAGNOSIS — Z4800 Encounter for change or removal of nonsurgical wound dressing: Secondary | ICD-10-CM | POA: Diagnosis present

## 2018-04-27 MED ORDER — DOXYCYCLINE HYCLATE 100 MG PO CAPS: 100.0000 mg | ORAL_CAPSULE | Freq: Two times a day (BID) | ORAL | 0 refills | Status: AC

## 2018-04-27 NOTE — ED Triage Notes (Signed)
Pt reports ulcer/wound to right foot x January, has been seen by multiple doctors and is followed by a wound clinic. Pt states he is not happy with the care he is receiving at his wound clinic and feels the creams he has been given are making his wound become larger. approx golf ball sized closed area noted to right outer ankle, black in center, slight erythema and edema, no drainage noted.

## 2018-04-27 NOTE — ED Provider Notes (Signed)
[facility] EMERGENCY DEPARTMENT Provider Note   CSN: [id number] Arrival date & time: 04/27/18  4098     History   Chief Complaint Chief Complaint  Patient presents with  . Wound Check    HPI [name] is a 84 y.o. male.  HPI 84 year old male presents the emergency department with a nonhealing ulcer of the right lateral malleolus.  He reports now he is developed some slight erythema and swelling.  He is currently not on antibiotics.  He recently underwent CT imaging of his aorta with bifemoral runoff down his lower extremities.  This demonstrated peripheral arterial disease below the level of the right popliteal.  This appears chronic.  He was told that he would be referred to vascular surgery but is received no referral to this point.  He is currently treating this with topical medications as prescribed by the wound care center.  He was seen by wound care yesterday.  He is scheduled to see wound care again on Thursday.  He does not smoke cigarettes.  No fevers or chills.   Past Medical History:  Diagnosis Date  . COPD (chronic obstructive pulmonary disease) (HCC)    uses advair and albuterol  . Diabetes mellitus without complication (HCC)    NIDDM  . GERD (gastroesophageal reflux disease)   . H/O hiatal hernia   . Hypertension   . Neuromuscular disorder (HCC)    CTS right hand  . Retinal detachment of right eye with single break     There are no active problems to display for this patient.   Past Surgical History:  Procedure Laterality Date  . CARPAL TUNNEL RELEASE Right 11/02/2012   Procedure: CARPAL TUNNEL RELEASE;  Surgeon: Nicki Reaper, MD;  Location: [facility];  Service: Orthopedics;  Laterality: Right;  . CARPAL TUNNEL RELEASE Left 12/21/2012   Procedure: CARPAL TUNNEL RELEASE;  Surgeon: Nicki Reaper, MD;  Location: [facility];  Service: Orthopedics;  Laterality: Left;  . HEMORROIDECTOMY    . JOINT REPLACEMENT     bil  hip arthroplasties  . POPLITEAL VENOUS ANEURYSM REPAIR    . TRIGGER FINGER RELEASE Right 11/02/2012   Procedure: RELEASE TRIGGER FINGER/A-1 PULLEY RIGHT MIDDLE FINGER, POSSIBLE REMOVAL PARTIAL FLEXOR SUPERFICIALIS;  Surgeon: Nicki Reaper, MD;  Location: [facility];  Service: Orthopedics;  Laterality: Right;  . TRIGGER FINGER RELEASE Left 12/21/2012   Procedure: RELEASE TRIGGER FINGER/A-1 PULLEY LEFT MIDDLE FINGER;  Surgeon: Nicki Reaper, MD;  Location: [facility];  Service: Orthopedics;  Laterality: Left;        Home Medications    Prior to Admission medications   Medication Sig Start Date End Date Taking? Authorizing Provider  diclofenac sodium (VOLTAREN) 1 % GEL Apply topically 4 (four) times daily.   Yes [provider]  gabapentin (NEURONTIN) 300 MG capsule Take 300 mg by mouth 3 (three) times daily.   Yes [provider]  Magnesium Gluconate (MAGNESIUM 27 PO) Take by mouth.   Yes [provider]  Potassium (POTASSIMIN PO) Take by mouth.   Yes [provider]  albuterol (PROVENTIL) (5 MG/ML) 0.5% nebulizer solution Take 2.5 mg by nebulization every 6 (six) hours as needed for wheezing.    [provider]  amoxicillin-clavulanate (AUGMENTIN) 875-125 MG tablet Take 1 tablet by mouth every 12 (twelve) hours. 08/03/15   Margarita Grizzle, MD  aspirin 81 MG tablet Take 81 mg by mouth daily.    [provider]  cyanocobalamin 500 MCG tablet Take 500 mcg by mouth daily.    [provider]  diphenhydramine-acetaminophen (TYLENOL PM) 25-500 MG TABS Take 1 tablet by mouth at bedtime as needed.    [provider]  doxycycline (VIBRAMYCIN) 100 MG capsule Take 1 capsule (100 mg total) by mouth 2 (two) times daily. 04/27/18   Azalia Bilis, MD  fish oil-omega-3 fatty acids 1000 MG capsule Take 2 g by mouth daily.    [provider]  Fluticasone-Salmeterol (ADVAIR) 500-50 MCG/DOSE AEPB Inhale 1 puff  into the lungs every 12 (twelve) hours.    [provider]  glipiZIDE (GLUCOTROL) 5 MG tablet Take 5 mg by mouth 2 (two) times daily before a meal.    [provider]  lisinopril (PRINIVIL,ZESTRIL) 5 MG tablet Take 5 mg by mouth daily.    [provider]  metoprolol tartrate (LOPRESSOR) 25 MG tablet Take 25 mg by mouth 2 (two) times daily.    [provider]  naproxen sodium (ANAPROX) 220 MG tablet Take 220 mg by mouth 2 (two) times daily with a meal.    [provider]  omeprazole (PRILOSEC) 40 MG capsule Take 40 mg by mouth daily.    [provider]  traMADol (ULTRAM) 50 MG tablet Take 1 tablet (50 mg total) by mouth every 6 (six) hours as needed for pain. 12/21/12   Cindee Salt, MD    Family History History reviewed. No pertinent family history.  Social History Social History   Tobacco Use  . Smoking status: Former Games developer  . Smokeless tobacco: Never Used  Substance Use Topics  . Alcohol use: Yes    Comment: social  . Drug use: No     Allergies   Codeine and Morphine and related   Review of Systems Review of Systems  All other systems reviewed and are negative.    Physical Exam Updated Vital Signs BP (!) 164/67 (BP Location: Right Arm)   Pulse 70   Temp 98 F (36.7 C) (Oral)   Resp 20   Ht 5\' 6"  (1.676 m)   Wt 72.6 kg   SpO2 98%   BMI 25.82 kg/m   Physical Exam  Constitutional: He is oriented to person, place, and time. He appears well-developed and well-nourished.  HENT:  Head: Normocephalic.  Eyes: EOM are normal.  Neck: Normal range of motion.  Pulmonary/Chest: Effort normal.  Abdominal: He exhibits no distension.  Musculoskeletal: Normal range of motion.  Motor of the right foot is normal.  Nonhealing ulcer of the right lateral malleolus.  Surrounding erythema including the dorsum of the foot with some mild warmth.  No extension past the proximal ankle.  Palpable DP artery.  No palpable PT artery on  the right  Neurological: He is alert and oriented to person, place, and time.  Psychiatric: He has a normal mood and affect.  Nursing note and vitals reviewed.    ED Treatments / Results  Labs (all labs ordered are listed, but only abnormal results are displayed) Labs Reviewed - No data to display  EKG None  Radiology  April 23, 2018 RIGHT Lower Extremity  Inflow: Common, internal and external iliac arteries are patent without evidence of aneurysm, dissection, vasculitis or significant stenosis.  Outflow: Artifact related to the bilateral hip replacements. Right common femoral artery is widely patent. The right profunda femoral arteries are patent. Atherosclerotic disease throughout the right SFA without significant stenosis. Diffuse atherosclerotic disease in popliteal artery with areas of mild  stenosis.  Runoff: Atherosclerotic disease involving the tibioperoneal trunk. Proximal occlusion of the posterior tibial artery. There may be some reconstitution of the posterior tibial artery near the ankle. Peroneal artery is patent with short-segment stenoses or occlusions in the distal calf. Anterior tibial artery is patent with segmental areas of stenosis throughout the right calf. High-grade stenosis or short segment occlusion of the anterior tibial artery in the distal calf on sequence 4, image 539 and high-grade stenosis or focal occlusion along the anterior aspect of the ankle on sequence 4, image 556. Dorsalis pedis artery is patent but diffusely diseased.  Procedures Procedures (including critical care time)  Medications Ordered in ED Medications - No data to display   Initial Impression / Assessment and Plan / ED Course  I have reviewed the triage vital signs and the nursing notes.  Pertinent labs & imaging results that were available during my care of the patient were reviewed by me and considered in my medical decision making (see chart for details).      Patient with peripheral arterial disease of the right lower extremity.  This will need to be assessed further by vascular surgeon for nonhealing ulcer of the right lateral malleolus.  Some developing erythema surrounding the nonhealing ulcer.  Patient be started on doxycycline.  Well-appearing.  Nontoxic.  Outpatient wound care follow-up and vascular surgery follow-up.  Patient will return to the emergency department for new or worsening symptoms.  Close primary care follow-up.  Patient and family agreeable to outpatient plan.  Final Clinical Impressions(s) / ED Diagnoses   Final diagnoses:  Cellulitis of right foot    ED Discharge Orders         Ordered    doxycycline (VIBRAMYCIN) 100 MG capsule  2 times daily     04/27/18 1013           Azalia Bilisampos, Shaneka Efaw, MD 04/27/18 1018

## 2018-04-27 NOTE — ED Notes (Signed)
ED Provider at bedside.
